# Patient Record
Sex: Female | Born: 1965 | Race: White | Hispanic: No | Marital: Married | Smoking: Never smoker
Health system: Southern US, Community
[De-identification: ages and names within clinical notes are randomized; demographics above are authoritative.]

## PROBLEM LIST (undated history)

## (undated) DIAGNOSIS — I341 Nonrheumatic mitral (valve) prolapse: Secondary | ICD-10-CM

## (undated) DIAGNOSIS — E039 Hypothyroidism, unspecified: Secondary | ICD-10-CM

## (undated) DIAGNOSIS — T7840XA Allergy, unspecified, initial encounter: Secondary | ICD-10-CM

## (undated) DIAGNOSIS — Z8669 Personal history of other diseases of the nervous system and sense organs: Secondary | ICD-10-CM

## (undated) DIAGNOSIS — E7800 Pure hypercholesterolemia, unspecified: Secondary | ICD-10-CM

## (undated) DIAGNOSIS — G4730 Sleep apnea, unspecified: Secondary | ICD-10-CM

## (undated) DIAGNOSIS — Z8489 Family history of other specified conditions: Secondary | ICD-10-CM

## (undated) HISTORY — DX: Hypothyroidism, unspecified: E03.9

## (undated) HISTORY — DX: Nonrheumatic mitral (valve) prolapse: I34.1

## (undated) HISTORY — DX: Allergy, unspecified, initial encounter: T78.40XA

## (undated) HISTORY — DX: Sleep apnea, unspecified: G47.30

## (undated) HISTORY — DX: Personal history of other diseases of the nervous system and sense organs: Z86.69

---

## 1984-11-23 HISTORY — PX: TMJ ARTHROSCOPY: SHX1067

## 1992-11-23 HISTORY — PX: LAPAROSCOPIC ABDOMINAL EXPLORATION: SHX6249

## 1998-01-25 ENCOUNTER — Inpatient Hospital Stay (HOSPITAL_COMMUNITY): Admission: AD | Admit: 1998-01-25 | Discharge: 1998-01-27 | Payer: Self-pay | Admitting: Obstetrics & Gynecology

## 1998-03-07 ENCOUNTER — Other Ambulatory Visit: Admission: RE | Admit: 1998-03-07 | Discharge: 1998-03-07 | Payer: Self-pay | Admitting: Obstetrics & Gynecology

## 1999-03-27 ENCOUNTER — Other Ambulatory Visit: Admission: RE | Admit: 1999-03-27 | Discharge: 1999-03-27 | Payer: Self-pay | Admitting: Obstetrics & Gynecology

## 2000-05-18 ENCOUNTER — Other Ambulatory Visit: Admission: RE | Admit: 2000-05-18 | Discharge: 2000-05-18 | Payer: Self-pay | Admitting: Obstetrics & Gynecology

## 2000-11-23 HISTORY — PX: BREAST BIOPSY: SHX20

## 2001-04-11 ENCOUNTER — Other Ambulatory Visit: Admission: RE | Admit: 2001-04-11 | Discharge: 2001-04-11 | Payer: Self-pay | Admitting: Unknown Physician Specialty

## 2001-04-11 ENCOUNTER — Encounter: Payer: Self-pay | Admitting: General Surgery

## 2001-04-11 ENCOUNTER — Encounter: Admission: RE | Admit: 2001-04-11 | Discharge: 2001-04-11 | Payer: Self-pay | Admitting: General Surgery

## 2001-04-11 ENCOUNTER — Encounter (INDEPENDENT_AMBULATORY_CARE_PROVIDER_SITE_OTHER): Payer: Self-pay | Admitting: Specialist

## 2001-07-13 ENCOUNTER — Other Ambulatory Visit: Admission: RE | Admit: 2001-07-13 | Discharge: 2001-07-13 | Payer: Self-pay | Admitting: Obstetrics & Gynecology

## 2002-12-05 ENCOUNTER — Other Ambulatory Visit: Admission: RE | Admit: 2002-12-05 | Discharge: 2002-12-05 | Payer: Self-pay | Admitting: Obstetrics & Gynecology

## 2003-04-12 ENCOUNTER — Emergency Department (HOSPITAL_COMMUNITY): Admission: EM | Admit: 2003-04-12 | Discharge: 2003-04-12 | Payer: Self-pay | Admitting: Emergency Medicine

## 2004-03-17 ENCOUNTER — Other Ambulatory Visit: Admission: RE | Admit: 2004-03-17 | Discharge: 2004-03-17 | Payer: Self-pay | Admitting: Obstetrics & Gynecology

## 2004-09-10 ENCOUNTER — Encounter: Admission: RE | Admit: 2004-09-10 | Discharge: 2004-09-10 | Payer: Self-pay | Admitting: Internal Medicine

## 2005-01-14 ENCOUNTER — Ambulatory Visit: Payer: Self-pay | Admitting: Cardiology

## 2005-01-22 ENCOUNTER — Ambulatory Visit: Payer: Self-pay

## 2005-08-11 ENCOUNTER — Other Ambulatory Visit: Admission: RE | Admit: 2005-08-11 | Discharge: 2005-08-11 | Payer: Self-pay | Admitting: Obstetrics & Gynecology

## 2007-02-10 ENCOUNTER — Encounter: Admission: RE | Admit: 2007-02-10 | Discharge: 2007-02-10 | Payer: Self-pay | Admitting: Obstetrics & Gynecology

## 2007-08-24 ENCOUNTER — Encounter: Admission: RE | Admit: 2007-08-24 | Discharge: 2007-08-24 | Payer: Self-pay | Admitting: Obstetrics & Gynecology

## 2008-01-31 ENCOUNTER — Ambulatory Visit: Payer: Self-pay | Admitting: Cardiology

## 2008-02-13 ENCOUNTER — Encounter: Admission: RE | Admit: 2008-02-13 | Discharge: 2008-02-13 | Payer: Self-pay | Admitting: Obstetrics & Gynecology

## 2008-02-15 ENCOUNTER — Ambulatory Visit: Payer: Self-pay

## 2008-02-15 ENCOUNTER — Encounter: Payer: Self-pay | Admitting: Cardiology

## 2008-02-28 ENCOUNTER — Encounter: Admission: RE | Admit: 2008-02-28 | Discharge: 2008-02-28 | Payer: Self-pay | Admitting: Obstetrics & Gynecology

## 2008-03-06 ENCOUNTER — Encounter: Admission: RE | Admit: 2008-03-06 | Discharge: 2008-03-06 | Payer: Self-pay | Admitting: Obstetrics & Gynecology

## 2008-09-28 ENCOUNTER — Encounter: Admission: RE | Admit: 2008-09-28 | Discharge: 2008-09-28 | Payer: Self-pay | Admitting: Obstetrics & Gynecology

## 2009-02-13 ENCOUNTER — Encounter: Admission: RE | Admit: 2009-02-13 | Discharge: 2009-02-13 | Payer: Self-pay | Admitting: Obstetrics & Gynecology

## 2010-03-10 ENCOUNTER — Encounter: Admission: RE | Admit: 2010-03-10 | Discharge: 2010-03-10 | Payer: Self-pay | Admitting: Obstetrics & Gynecology

## 2010-03-12 ENCOUNTER — Ambulatory Visit: Payer: Self-pay | Admitting: Cardiology

## 2010-03-12 DIAGNOSIS — R002 Palpitations: Secondary | ICD-10-CM | POA: Insufficient documentation

## 2010-12-18 ENCOUNTER — Encounter
Admission: RE | Admit: 2010-12-18 | Discharge: 2010-12-18 | Payer: Self-pay | Source: Home / Self Care | Attending: Obstetrics & Gynecology | Admitting: Obstetrics & Gynecology

## 2010-12-23 NOTE — Assessment & Plan Note (Signed)
Summary: f2y/ gd   Visit Type:  2 yr f/u Primary Provider:  Dr. Felipa Eth  CC:  no cardiac complaints today.  History of Present Illness: Cynthia Flowers returns today for evaluation management or palpitations. She been originally diagnosed with mitral valve prolapse but this has not shown up on 2 echocardiograms.  She's had no palpitations or chest pain. She remains very active and tries to walk as much as possible. She tried beta blockers in the past which made her feel worse.  Current Medications (verified): 1)  Multivitamins   Tabs (Multiple Vitamin) .Marland Kitchen.. 1 Tab Once Daily 2)  Levothyroxine Sodium 75 Mcg Tabs (Levothyroxine Sodium) .Marland Kitchen.. 1 Tab Once Daily 3)  Goodys Powder .... 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  Family History: Last updated: 03/07/2010  No premature history of coronary disease.  Mother had   some had coronary disease and heart attacks in her late 58s.   Social History: Last updated: 03/07/2010  She is married.  She has one child.  She is a   bookkeeper.   Review of Systems       negative other than history of present illness  Vital Signs:  Patient profile:   45 year old female Height:      66 inches Weight:      165 pounds BMI:     26.73 Pulse rate:   64 / minute Pulse rhythm:   regular BP sitting:   110 / 80  (left arm) Cuff size:   large  Vitals Entered By: Danielle Rankin, CMA (March 12, 2010 10:45 AM)  Physical Exam  General:  overweight, pleasant, in no acute distress Head:  normocephalic and atraumatic Eyes:  PERRLA/EOM intact; conjunctiva and lids normal. Neck:  Neck supple, no JVD. No masses, thyromegaly or abnormal cervical nodes. Chest Cynthia Flowers:  no deformities or breast masses noted Lungs:  Clear  bilaterally to auscultation and percussion. Heart:  Non-displaced PMI, chest non-tender; regular rate and rhythm, S1, S2 without murmurs, rubs or gallops. Carotid upstroke normal, no bruit. Normal abdominal aortic size, no bruits. Femorals normal pulses, no bruits. Pedals normal pulses. No edema, no varicosities. Abdomen:  Bowel sounds positive; abdomen soft and non-tender without masses, organomegaly, or hernias noted. No hepatosplenomegaly. Msk:  Back normal, normal gait. Muscle strength and tone normal. Pulses:  pulses normal in all 4 extremities Extremities:  No clubbing or cyanosis. Neurologic:  Alert and oriented x 3.   Problems:  Medical Problems Added: 1)  Dx of Palpitations  (ICD-785.1)  EKG  Procedure date:  03/12/2010  Findings:      normal sinus rhythm, normal EKG  Impression & Recommendations:  Problem # 1:  PALPITATIONS (ICD-785.1) Assessment Improved Her palpitations are almost nonexistent. She continues to exercise. She tried beta blockers in the past which made her feel worse. She does not have prolapse by echocardiography as she been told in the past. No change in strategy at this point. I'll see her back on a p.r.n. basis.  Other Orders: EKG w/ Interpretation (93000)  Patient Instructions: 1)  Your physician recommends that you schedule a follow-up appointment in: AS NEEDED 2)  Your physician recommends that you continue on your current medications as directed. Please refer to the Current Medication list given to you today.

## 2011-04-07 NOTE — Assessment & Plan Note (Signed)
[facility]                            CARDIOLOGY OFFICE NOTE   AVA, TANGNEY                        MRN:          [id number]  DATE:01/31/2008                            DOB:          25-Sep-1966    I was asked by Dr. Felipa Eth to consult on Cynthia Flowers with a history of  mitral valve prolapse.   We last evaluated Cynthia Flowers in March 2006.  A 2-D echocardiogram at that  time did not show any significant prolapse; in fact, it showed no  prolapse.  There was no mitral regurgitation either.   She had normal left ventricular function, normal right-sided structures.  Essentially, the echocardiogram was normal.  She was reassured.   She has had some atypical chest pain, and some palpitations.  She does  not notice these with exertion or activity.  She notices it most of the  time when she is sitting still or lying down.  She has no significant  dyspnea on exertion.   She denies any syncope or presyncope.  She has had no symptoms  consistent with angina.   Her risk factors are minimal at this point for coronary disease.   PAST MEDICAL HISTORY:   CURRENT MEDICATIONS:  Multivitamin daily with thyroxine 75 mcg a day.   She does not smoke, does not drink.   ALLERGIES:  She is not allergic to any medications.  She does not have a  dye reaction.   SURGERY:  1. 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  2. Laparoscopic surgery of to remove some adhesions in 1992.   She carries a diagnosis of hypothyroidism on replacement therapy.   FAMILY HISTORY:  No premature history of coronary disease.  Mother had  some had coronary disease and heart attacks in her late 88s.   SOCIAL HISTORY:  She is married.  She has one child.  She is a  bookkeeper.   REVIEW OF SYSTEMS:  Other than HPI, all 15 Review of System point  markers were reviewed.   PHYSICAL EXAMINATION:  VITAL SIGNS:  Her blood pressure today is 130/71.  Her pulse is 78 and regular.  Weight is 152.  HEENT:   Normocephalic, atraumatic.  PERRLA.  Extraocular movements  intact.  Sclerae are clear.  Face symmetry is normal.  NECK:  Carotids are equal bilaterally without bruits, no JVD.  Thyroid  is not enlarged.  Trachea is midline.  LUNGS:  Clear.  HEART:  Reveals a regular rate and rhythm.  PMI is nondisplaced.  She  has no obvious click.  No murmur.  S2 is split.  ABDOMEN:  Soft, good bowel sounds.  No midline bruit, no hepatomegaly.  EXTREMITIES:  No sinus clubbing or edema.  Pulses are intact.  NEUROLOGIC:  Exam is intact.   Electrocardiogram is completely normal.   ASSESSMENT:  Atypical chest pain and palpitations.  She has been told  that she has mitral valve prolapse in the past. but this was not  documented on her last echocardiogram.  The patient needs reassurance.   PLAN:  1. A 2-D echocardiogram to assess  LV function and to again look at the      mitral valve.  2. Reassurance.  3. Walking or some sort of cardiac activity 3 hours per week.  4. Followup blood work with Dr. Felipa Eth including her thyroid      replacement.   I will plan on seeing her back in 2 years or p.r.n.     Thomas C. Daleen Squibb, MD, [facility]  Electronically Signed    TCW/MedQ  DD: 01/31/2008  DT: 02/01/2008  Job #: [id number]   cc:   Larina Earthly, M.D.

## 2011-04-09 ENCOUNTER — Other Ambulatory Visit: Payer: Self-pay | Admitting: Obstetrics & Gynecology

## 2011-04-09 DIAGNOSIS — Z1231 Encounter for screening mammogram for malignant neoplasm of breast: Secondary | ICD-10-CM

## 2011-04-23 ENCOUNTER — Ambulatory Visit: Payer: Self-pay

## 2011-04-24 ENCOUNTER — Ambulatory Visit
Admission: RE | Admit: 2011-04-24 | Discharge: 2011-04-24 | Disposition: A | Discharge status: Home / Self Care | Payer: BC Managed Care – PPO | Source: Ambulatory Visit | Attending: Obstetrics & Gynecology | Admitting: Obstetrics & Gynecology

## 2011-04-24 ENCOUNTER — Ambulatory Visit: Payer: Self-pay

## 2011-04-24 DIAGNOSIS — Z1231 Encounter for screening mammogram for malignant neoplasm of breast: Secondary | ICD-10-CM

## 2011-11-24 HISTORY — PX: BREAST BIOPSY: SHX20

## 2012-05-13 ENCOUNTER — Other Ambulatory Visit: Payer: Self-pay | Admitting: Obstetrics & Gynecology

## 2012-05-13 DIAGNOSIS — Z1231 Encounter for screening mammogram for malignant neoplasm of breast: Secondary | ICD-10-CM

## 2012-06-22 ENCOUNTER — Ambulatory Visit
Admission: RE | Admit: 2012-06-22 | Discharge: 2012-06-22 | Disposition: A | Discharge status: Home / Self Care | Payer: BC Managed Care – PPO | Source: Ambulatory Visit | Attending: Obstetrics & Gynecology | Admitting: Obstetrics & Gynecology

## 2012-06-22 DIAGNOSIS — Z1231 Encounter for screening mammogram for malignant neoplasm of breast: Secondary | ICD-10-CM

## 2012-06-24 ENCOUNTER — Other Ambulatory Visit: Payer: Self-pay | Admitting: Obstetrics & Gynecology

## 2012-06-24 DIAGNOSIS — R928 Other abnormal and inconclusive findings on diagnostic imaging of breast: Secondary | ICD-10-CM

## 2012-07-13 ENCOUNTER — Other Ambulatory Visit: Payer: Self-pay | Admitting: Obstetrics & Gynecology

## 2012-07-13 ENCOUNTER — Ambulatory Visit
Admission: RE | Admit: 2012-07-13 | Discharge: 2012-07-13 | Disposition: A | Discharge status: Home / Self Care | Payer: BC Managed Care – PPO | Source: Ambulatory Visit | Attending: Obstetrics & Gynecology | Admitting: Obstetrics & Gynecology

## 2012-07-13 DIAGNOSIS — R928 Other abnormal and inconclusive findings on diagnostic imaging of breast: Secondary | ICD-10-CM

## 2012-07-19 ENCOUNTER — Other Ambulatory Visit: Payer: Self-pay | Admitting: Obstetrics & Gynecology

## 2012-07-19 ENCOUNTER — Ambulatory Visit
Admission: RE | Admit: 2012-07-19 | Discharge: 2012-07-19 | Disposition: A | Discharge status: Home / Self Care | Payer: BC Managed Care – PPO | Source: Ambulatory Visit | Attending: Obstetrics & Gynecology | Admitting: Obstetrics & Gynecology

## 2012-07-19 DIAGNOSIS — R928 Other abnormal and inconclusive findings on diagnostic imaging of breast: Secondary | ICD-10-CM

## 2013-08-01 ENCOUNTER — Other Ambulatory Visit: Payer: Self-pay | Admitting: Obstetrics & Gynecology

## 2013-08-01 ENCOUNTER — Other Ambulatory Visit: Payer: Self-pay

## 2013-08-01 DIAGNOSIS — Z1231 Encounter for screening mammogram for malignant neoplasm of breast: Secondary | ICD-10-CM

## 2013-08-24 ENCOUNTER — Ambulatory Visit
Admission: RE | Admit: 2013-08-24 | Discharge: 2013-08-24 | Disposition: A | Discharge status: Home / Self Care | Payer: BC Managed Care – HMO | Source: Ambulatory Visit

## 2013-08-24 DIAGNOSIS — Z1231 Encounter for screening mammogram for malignant neoplasm of breast: Secondary | ICD-10-CM

## 2014-08-14 ENCOUNTER — Other Ambulatory Visit: Payer: Self-pay

## 2014-08-14 DIAGNOSIS — Z1231 Encounter for screening mammogram for malignant neoplasm of breast: Secondary | ICD-10-CM

## 2014-08-28 ENCOUNTER — Ambulatory Visit
Admission: RE | Admit: 2014-08-28 | Discharge: 2014-08-28 | Disposition: A | Discharge status: Home / Self Care | Payer: BC Managed Care – PPO | Source: Ambulatory Visit

## 2014-08-28 ENCOUNTER — Encounter (INDEPENDENT_AMBULATORY_CARE_PROVIDER_SITE_OTHER): Payer: Self-pay

## 2014-08-28 DIAGNOSIS — Z1231 Encounter for screening mammogram for malignant neoplasm of breast: Secondary | ICD-10-CM

## 2014-09-17 ENCOUNTER — Other Ambulatory Visit: Payer: Self-pay | Admitting: Obstetrics & Gynecology

## 2014-09-18 LAB — CYTOLOGY - PAP

## 2014-11-23 HISTORY — PX: OTHER SURGICAL HISTORY: SHX169

## 2015-10-07 ENCOUNTER — Encounter: Payer: Self-pay | Admitting: Internal Medicine

## 2015-11-28 ENCOUNTER — Ambulatory Visit (AMBULATORY_SURGERY_CENTER): Payer: Self-pay

## 2015-11-28 VITALS — Ht 66.0 in | Wt 171.6 lb

## 2015-11-28 DIAGNOSIS — Z1211 Encounter for screening for malignant neoplasm of colon: Secondary | ICD-10-CM

## 2015-11-28 MED ORDER — SUPREP BOWEL PREP KIT 17.5-3.13-1.6 GM/177ML PO SOLN: 1.0000 | Freq: Once | ORAL | Status: DC

## 2015-11-28 NOTE — Progress Notes (Signed)
No allergies to eggs or soy No home oxygen No past problems with anesthesia No diet/weight loss meds  Has internet; registered for emmi

## 2015-12-12 ENCOUNTER — Telehealth: Payer: Self-pay | Admitting: Internal Medicine

## 2015-12-12 ENCOUNTER — Encounter: Payer: Self-pay | Admitting: Internal Medicine

## 2016-01-01 ENCOUNTER — Other Ambulatory Visit: Payer: Self-pay

## 2016-01-01 DIAGNOSIS — Z1231 Encounter for screening mammogram for malignant neoplasm of breast: Secondary | ICD-10-CM

## 2016-01-23 ENCOUNTER — Ambulatory Visit (AMBULATORY_SURGERY_CENTER): Payer: Self-pay | Admitting: *Deleted

## 2016-01-23 VITALS — Ht 66.0 in | Wt 176.0 lb

## 2016-01-23 DIAGNOSIS — Z1211 Encounter for screening for malignant neoplasm of colon: Secondary | ICD-10-CM

## 2016-01-23 MED ORDER — NA SULFATE-K SULFATE-MG SULF 17.5-3.13-1.6 GM/177ML PO SOLN: ORAL | Status: DC

## 2016-01-23 NOTE — Progress Notes (Signed)
No allergies to eggs or soy. No problems with anesthesia.  Pt not given Emmi instructions for colonoscopy; has recently viewed  No oxygen use  No diet drug use

## 2016-02-03 ENCOUNTER — Other Ambulatory Visit: Payer: Self-pay | Admitting: Obstetrics & Gynecology

## 2016-02-03 DIAGNOSIS — N6320 Unspecified lump in the left breast, unspecified quadrant: Secondary | ICD-10-CM

## 2016-02-06 ENCOUNTER — Encounter: Payer: Self-pay | Admitting: Internal Medicine

## 2016-02-06 ENCOUNTER — Ambulatory Visit (AMBULATORY_SURGERY_CENTER): Payer: BLUE CROSS/BLUE SHIELD | Admitting: Internal Medicine

## 2016-02-06 VITALS — BP 135/76 | HR 59 | Temp 98.9°F | Resp 9 | Ht 66.0 in | Wt 176.0 lb

## 2016-02-06 DIAGNOSIS — Z1211 Encounter for screening for malignant neoplasm of colon: Secondary | ICD-10-CM | POA: Diagnosis present

## 2016-02-06 DIAGNOSIS — D123 Benign neoplasm of transverse colon: Secondary | ICD-10-CM

## 2016-02-06 DIAGNOSIS — D125 Benign neoplasm of sigmoid colon: Secondary | ICD-10-CM | POA: Diagnosis not present

## 2016-02-06 MED ORDER — SODIUM CHLORIDE 0.9 % IV SOLN: 500.0000 mL | INTRAVENOUS | Status: DC

## 2016-02-06 NOTE — Patient Instructions (Signed)
YOU HAD AN ENDOSCOPIC PROCEDURE TODAY AT [facility]:   Refer to the procedure report that was given to you for any specific questions about what was found during the examination.  If the procedure report does not answer your questions, please call your gastroenterologist to clarify.  If you requested that your care partner not be given the details of your procedure findings, then the procedure report has been included in a sealed envelope for you to review at your convenience later.  YOU SHOULD EXPECT: Some feelings of bloating in the abdomen. Passage of more gas than usual.  Walking can help get rid of the air that was put into your GI tract during the procedure and reduce the bloating. If you had a lower endoscopy (such as a colonoscopy or flexible sigmoidoscopy) you may notice spotting of blood in your stool or on the toilet paper. If you underwent a bowel prep for your procedure, you may not have a normal bowel movement for a few days.  Please Note:  You might notice some irritation and congestion in your nose or some drainage.  This is from the oxygen used during your procedure.  There is no need for concern and it should clear up in a day or so.  SYMPTOMS TO REPORT IMMEDIATELY:   Following lower endoscopy (colonoscopy or flexible sigmoidoscopy):  Excessive amounts of blood in the stool  Significant tenderness or worsening of abdominal pains  Swelling of the abdomen that is new, acute  Fever of 100F or higher    For urgent or emergent issues, a gastroenterologist can be reached at any hour by calling [phone].   DIET: Your first meal following the procedure should be a small meal and then it is ok to progress to your normal diet. Heavy or fried foods are harder to digest and may make you feel nauseous or bloated.  Likewise, meals heavy in dairy and vegetables can increase bloating.  Drink plenty of fluids but you should avoid alcoholic beverages for 24  hours.  ACTIVITY:  You should plan to take it easy for the rest of today and you should NOT DRIVE or use heavy machinery until tomorrow (because of the sedation medicines used during the test).    FOLLOW UP: Our staff will call the number listed on your records the next business day following your procedure to check on you and address any questions or concerns that you may have regarding the information given to you following your procedure. If we do not reach you, we will leave a message.  However, if you are feeling well and you are not experiencing any problems, there is no need to return our call.  We will assume that you have returned to your regular daily activities without incident.  If any biopsies were taken you will be contacted by phone or by letter within the next 1-3 weeks.  Please call us at [phone] if you have not heard about the biopsies in 3 weeks.    SIGNATURES/CONFIDENTIALITY: You and/or your care partner have signed paperwork which will be entered into your electronic medical record.  These signatures attest to the fact that that the information above on your After Visit Summary has been reviewed and is understood.  Full responsibility of the confidentiality of this discharge information lies with you and/or your care-partner.  Please read all the handouts given to you by your recovery nurse. Thank you for letting us take care of your healthcare needs.

## 2016-02-06 NOTE — Progress Notes (Signed)
Called to room to assist during endoscopic procedure.  Patient ID and intended procedure confirmed with present staff. Received instructions for my participation in the procedure from the performing physician.  

## 2016-02-06 NOTE — Progress Notes (Signed)
Report to PACU, RN, vss, BBS= Clear.  

## 2016-02-06 NOTE — Op Note (Signed)
[facility] Patient Name: Cynthia Flowers Procedure Date: 02/06/2016 10:58 AM MRN: [id number] Endoscopist: Jerene Bears , MD Age: 50 Referring MD:  Date of Birth: 01-28-1966 Gender: Female Procedure:                Colonoscopy Indications:              Screening for colorectal malignant neoplasm, This                            is the patient's first colonoscopy Medicines:                Monitored Anesthesia Care Procedure:                Pre-Anesthesia Assessment:                           - Prior to the procedure, a History and Physical                            was performed, and patient medications and                            allergies were reviewed. The patient's tolerance of                            previous anesthesia was also reviewed. The risks                            and benefits of the procedure and the sedation                            options and risks were discussed with the patient.                            All questions were answered, and informed consent                            was obtained. Prior Anticoagulants: The patient has                            taken no previous anticoagulant or antiplatelet                            agents. ASA Grade Assessment: II - A patient with                            mild systemic disease. After reviewing the risks                            and benefits, the patient was deemed in                            satisfactory condition to undergo the procedure.  After obtaining informed consent, the colonoscope                            was passed under direct vision. Throughout the                            procedure, the patient's blood pressure, pulse, and                            oxygen saturations were monitored continuously. The                            Model PCF-H190L [id number]) scope was introduced                            through the anus and advanced to the the cecum,                           identified by appendiceal orifice and ileocecal                            valve. The colonoscopy was performed without                            difficulty. The patient tolerated the procedure                            well. The quality of the bowel preparation was                            good. The ileocecal valve, appendiceal orifice, and                            rectum were photographed. Scope In: 11:01:49 AM Scope Out: 11:19:09 AM Scope Withdrawal Time: 0 hours 13 minutes 39 seconds  Total Procedure Duration: 0 hours 17 minutes 20 seconds  Findings:      The digital rectal exam was normal.      Three sessile polyps were found in the sigmoid colon (2) and hepatic       flexure (1). The polyps were 3 to 5 mm in size. These polyps were       removed with a cold snare. Resection and retrieval were complete.      The exam was otherwise without abnormality on direct and retroflexion       views. Complications:            No immediate complications. Estimated Blood Loss:     Estimated blood loss was minimal. Impression:               - Three 3 to 5 mm polyps in the sigmoid colon and                            at the hepatic flexure, removed with a cold snare.  Resected and retrieved.                           - The examination was otherwise normal on direct                            and retroflexion views. Recommendation:           - Patient has a contact number available for                            emergencies. The signs and symptoms of potential                            delayed complications were discussed with the                            patient. Return to normal activities tomorrow.                            Written discharge instructions were provided to the                            patient.                           - Resume previous diet.                           - Continue present medications.                            - Await pathology results.                           - Repeat colonoscopy is recommended for                            surveillance. The colonoscopy date will be                            determined after pathology results from today's                            exam become available for review. Procedure Code(s):        --- Professional ---                           [phone], Colonoscopy, flexible; with removal of                            tumor(s), polyp(s), or other lesion(s) by snare                            technique CPT copyright 2016 [employer]. All rights reserved. Lajuan Lines. Hilarie Fredrickson, MD Jerene Bears, MD 02/06/2016 11:23:36 AM This report has been signed  electronically. Number of Addenda: 0

## 2016-02-07 ENCOUNTER — Telehealth: Payer: Self-pay | Admitting: *Deleted

## 2016-02-07 NOTE — Telephone Encounter (Signed)
  Follow up Call-  Call back number 02/06/2016  Post procedure Call Back phone  # [phone]  Permission to leave phone message Yes     Patient questions:  Do you have a fever, pain , or abdominal swelling? No. Pain Score  0 *  Have you tolerated food without any problems? Yes  Have you been able to return to your normal activities? Yes.    Do you have any questions about your discharge instructions: Diet   No. Medications  No. Follow up visit  No.  Do you have questions or concerns about your Care? No.  Actions: * If pain score is 4 or above: No action needed, pain <4.

## 2016-02-11 ENCOUNTER — Ambulatory Visit
Admission: RE | Admit: 2016-02-11 | Discharge: 2016-02-11 | Disposition: A | Discharge status: Home / Self Care | Payer: BLUE CROSS/BLUE SHIELD | Source: Ambulatory Visit | Attending: Obstetrics & Gynecology | Admitting: Obstetrics & Gynecology

## 2016-02-11 ENCOUNTER — Encounter: Payer: Self-pay | Admitting: Internal Medicine

## 2016-02-11 ENCOUNTER — Ambulatory Visit: Payer: BLUE CROSS/BLUE SHIELD

## 2016-02-11 DIAGNOSIS — N6320 Unspecified lump in the left breast, unspecified quadrant: Secondary | ICD-10-CM

## 2016-08-31 DIAGNOSIS — E038 Other specified hypothyroidism: Secondary | ICD-10-CM | POA: Diagnosis not present

## 2016-08-31 DIAGNOSIS — R8299 Other abnormal findings in urine: Secondary | ICD-10-CM | POA: Diagnosis not present

## 2016-08-31 DIAGNOSIS — Z0000 Encounter for general adult medical examination without abnormal findings: Secondary | ICD-10-CM | POA: Diagnosis not present

## 2016-09-07 DIAGNOSIS — H02402 Unspecified ptosis of left eyelid: Secondary | ICD-10-CM | POA: Diagnosis not present

## 2016-09-07 DIAGNOSIS — E038 Other specified hypothyroidism: Secondary | ICD-10-CM | POA: Diagnosis not present

## 2016-09-07 DIAGNOSIS — Z0000 Encounter for general adult medical examination without abnormal findings: Secondary | ICD-10-CM | POA: Diagnosis not present

## 2016-09-07 DIAGNOSIS — R109 Unspecified abdominal pain: Secondary | ICD-10-CM | POA: Diagnosis not present

## 2016-09-07 DIAGNOSIS — E784 Other hyperlipidemia: Secondary | ICD-10-CM | POA: Diagnosis not present

## 2016-09-11 DIAGNOSIS — Z1212 Encounter for screening for malignant neoplasm of rectum: Secondary | ICD-10-CM | POA: Diagnosis not present

## 2017-01-20 ENCOUNTER — Other Ambulatory Visit: Payer: Self-pay | Admitting: Internal Medicine

## 2017-01-20 DIAGNOSIS — Z1231 Encounter for screening mammogram for malignant neoplasm of breast: Secondary | ICD-10-CM

## 2017-02-01 DIAGNOSIS — Z6831 Body mass index (BMI) 31.0-31.9, adult: Secondary | ICD-10-CM | POA: Diagnosis not present

## 2017-02-01 DIAGNOSIS — Z01419 Encounter for gynecological examination (general) (routine) without abnormal findings: Secondary | ICD-10-CM | POA: Diagnosis not present

## 2017-02-01 DIAGNOSIS — Z13820 Encounter for screening for osteoporosis: Secondary | ICD-10-CM | POA: Diagnosis not present

## 2017-02-12 ENCOUNTER — Ambulatory Visit
Admission: RE | Admit: 2017-02-12 | Discharge: 2017-02-12 | Disposition: A | Discharge status: Home / Self Care | Payer: BLUE CROSS/BLUE SHIELD | Source: Ambulatory Visit | Attending: Internal Medicine | Admitting: Internal Medicine

## 2017-02-12 DIAGNOSIS — Z1231 Encounter for screening mammogram for malignant neoplasm of breast: Secondary | ICD-10-CM

## 2017-02-15 ENCOUNTER — Other Ambulatory Visit: Payer: Self-pay | Admitting: Obstetrics & Gynecology

## 2017-02-15 DIAGNOSIS — Z1231 Encounter for screening mammogram for malignant neoplasm of breast: Secondary | ICD-10-CM

## 2017-02-16 ENCOUNTER — Ambulatory Visit
Admission: RE | Admit: 2017-02-16 | Discharge: 2017-02-16 | Disposition: A | Discharge status: Home / Self Care | Payer: BLUE CROSS/BLUE SHIELD | Source: Ambulatory Visit | Attending: Obstetrics & Gynecology | Admitting: Obstetrics & Gynecology

## 2017-02-16 DIAGNOSIS — Z1231 Encounter for screening mammogram for malignant neoplasm of breast: Secondary | ICD-10-CM | POA: Diagnosis not present

## 2017-06-24 DIAGNOSIS — H02431 Paralytic ptosis of right eyelid: Secondary | ICD-10-CM | POA: Diagnosis not present

## 2017-09-04 DIAGNOSIS — Z23 Encounter for immunization: Secondary | ICD-10-CM | POA: Diagnosis not present

## 2018-01-12 ENCOUNTER — Other Ambulatory Visit: Payer: Self-pay | Admitting: Obstetrics & Gynecology

## 2018-01-12 DIAGNOSIS — Z1231 Encounter for screening mammogram for malignant neoplasm of breast: Secondary | ICD-10-CM

## 2018-02-03 DIAGNOSIS — Z808 Family history of malignant neoplasm of other organs or systems: Secondary | ICD-10-CM | POA: Diagnosis not present

## 2018-02-03 DIAGNOSIS — Z806 Family history of leukemia: Secondary | ICD-10-CM | POA: Diagnosis not present

## 2018-02-03 DIAGNOSIS — Z8041 Family history of malignant neoplasm of ovary: Secondary | ICD-10-CM | POA: Diagnosis not present

## 2018-02-03 DIAGNOSIS — Z01419 Encounter for gynecological examination (general) (routine) without abnormal findings: Secondary | ICD-10-CM | POA: Diagnosis not present

## 2018-02-03 DIAGNOSIS — Z6832 Body mass index (BMI) 32.0-32.9, adult: Secondary | ICD-10-CM | POA: Diagnosis not present

## 2018-02-03 DIAGNOSIS — Z8049 Family history of malignant neoplasm of other genital organs: Secondary | ICD-10-CM | POA: Diagnosis not present

## 2018-02-17 ENCOUNTER — Ambulatory Visit
Admission: RE | Admit: 2018-02-17 | Discharge: 2018-02-17 | Disposition: A | Discharge status: Home / Self Care | Payer: BLUE CROSS/BLUE SHIELD | Source: Ambulatory Visit | Attending: Obstetrics & Gynecology | Admitting: Obstetrics & Gynecology

## 2018-02-17 DIAGNOSIS — Z1231 Encounter for screening mammogram for malignant neoplasm of breast: Secondary | ICD-10-CM | POA: Diagnosis not present

## 2018-02-21 DIAGNOSIS — Z0000 Encounter for general adult medical examination without abnormal findings: Secondary | ICD-10-CM | POA: Diagnosis not present

## 2018-02-21 DIAGNOSIS — E038 Other specified hypothyroidism: Secondary | ICD-10-CM | POA: Diagnosis not present

## 2018-02-28 DIAGNOSIS — R109 Unspecified abdominal pain: Secondary | ICD-10-CM | POA: Diagnosis not present

## 2018-02-28 DIAGNOSIS — Z1389 Encounter for screening for other disorder: Secondary | ICD-10-CM | POA: Diagnosis not present

## 2018-02-28 DIAGNOSIS — E038 Other specified hypothyroidism: Secondary | ICD-10-CM | POA: Diagnosis not present

## 2018-02-28 DIAGNOSIS — E7849 Other hyperlipidemia: Secondary | ICD-10-CM | POA: Diagnosis not present

## 2018-02-28 DIAGNOSIS — Z0000 Encounter for general adult medical examination without abnormal findings: Secondary | ICD-10-CM | POA: Diagnosis not present

## 2018-02-28 DIAGNOSIS — E668 Other obesity: Secondary | ICD-10-CM | POA: Diagnosis not present

## 2018-03-15 DIAGNOSIS — Z1212 Encounter for screening for malignant neoplasm of rectum: Secondary | ICD-10-CM | POA: Diagnosis not present

## 2018-03-21 DIAGNOSIS — Z809 Family history of malignant neoplasm, unspecified: Secondary | ICD-10-CM | POA: Diagnosis not present

## 2018-08-16 DIAGNOSIS — M79672 Pain in left foot: Secondary | ICD-10-CM | POA: Diagnosis not present

## 2018-08-16 DIAGNOSIS — M722 Plantar fascial fibromatosis: Secondary | ICD-10-CM | POA: Diagnosis not present

## 2018-08-31 DIAGNOSIS — M79672 Pain in left foot: Secondary | ICD-10-CM | POA: Diagnosis not present

## 2018-09-29 DIAGNOSIS — M25572 Pain in left ankle and joints of left foot: Secondary | ICD-10-CM | POA: Diagnosis not present

## 2018-09-29 DIAGNOSIS — M79672 Pain in left foot: Secondary | ICD-10-CM | POA: Diagnosis not present

## 2018-09-29 DIAGNOSIS — M722 Plantar fascial fibromatosis: Secondary | ICD-10-CM | POA: Diagnosis not present

## 2018-09-30 DIAGNOSIS — K2970 Gastritis, unspecified, without bleeding: Secondary | ICD-10-CM | POA: Diagnosis not present

## 2018-09-30 DIAGNOSIS — Z6832 Body mass index (BMI) 32.0-32.9, adult: Secondary | ICD-10-CM | POA: Diagnosis not present

## 2018-09-30 DIAGNOSIS — M170 Bilateral primary osteoarthritis of knee: Secondary | ICD-10-CM | POA: Diagnosis not present

## 2018-09-30 DIAGNOSIS — E669 Obesity, unspecified: Secondary | ICD-10-CM | POA: Diagnosis not present

## 2018-10-13 DIAGNOSIS — M79672 Pain in left foot: Secondary | ICD-10-CM | POA: Diagnosis not present

## 2018-10-18 DIAGNOSIS — M722 Plantar fascial fibromatosis: Secondary | ICD-10-CM | POA: Diagnosis not present

## 2018-10-18 DIAGNOSIS — M79672 Pain in left foot: Secondary | ICD-10-CM | POA: Diagnosis not present

## 2018-11-07 DIAGNOSIS — H1013 Acute atopic conjunctivitis, bilateral: Secondary | ICD-10-CM | POA: Diagnosis not present

## 2018-11-14 DIAGNOSIS — M722 Plantar fascial fibromatosis: Secondary | ICD-10-CM | POA: Diagnosis not present

## 2019-01-12 ENCOUNTER — Other Ambulatory Visit: Payer: Self-pay | Admitting: Obstetrics & Gynecology

## 2019-01-12 DIAGNOSIS — Z1231 Encounter for screening mammogram for malignant neoplasm of breast: Secondary | ICD-10-CM

## 2019-02-07 DIAGNOSIS — Z6833 Body mass index (BMI) 33.0-33.9, adult: Secondary | ICD-10-CM | POA: Diagnosis not present

## 2019-02-07 DIAGNOSIS — Z01419 Encounter for gynecological examination (general) (routine) without abnormal findings: Secondary | ICD-10-CM | POA: Diagnosis not present

## 2019-02-20 ENCOUNTER — Ambulatory Visit: Payer: BLUE CROSS/BLUE SHIELD

## 2019-02-28 DIAGNOSIS — E039 Hypothyroidism, unspecified: Secondary | ICD-10-CM | POA: Diagnosis not present

## 2019-02-28 DIAGNOSIS — Z0000 Encounter for general adult medical examination without abnormal findings: Secondary | ICD-10-CM | POA: Diagnosis not present

## 2019-02-28 DIAGNOSIS — E7849 Other hyperlipidemia: Secondary | ICD-10-CM | POA: Diagnosis not present

## 2019-03-08 DIAGNOSIS — E039 Hypothyroidism, unspecified: Secondary | ICD-10-CM | POA: Diagnosis not present

## 2019-03-08 DIAGNOSIS — E669 Obesity, unspecified: Secondary | ICD-10-CM | POA: Diagnosis not present

## 2019-03-08 DIAGNOSIS — E785 Hyperlipidemia, unspecified: Secondary | ICD-10-CM | POA: Diagnosis not present

## 2019-03-08 DIAGNOSIS — Z1331 Encounter for screening for depression: Secondary | ICD-10-CM | POA: Diagnosis not present

## 2019-03-08 DIAGNOSIS — M170 Bilateral primary osteoarthritis of knee: Secondary | ICD-10-CM | POA: Diagnosis not present

## 2019-03-08 DIAGNOSIS — Z0000 Encounter for general adult medical examination without abnormal findings: Secondary | ICD-10-CM | POA: Diagnosis not present

## 2019-03-21 ENCOUNTER — Ambulatory Visit: Payer: BLUE CROSS/BLUE SHIELD

## 2019-05-15 ENCOUNTER — Ambulatory Visit
Admission: RE | Admit: 2019-05-15 | Discharge: 2019-05-15 | Disposition: A | Discharge status: Home / Self Care | Payer: BC Managed Care – PPO | Source: Ambulatory Visit | Attending: Obstetrics & Gynecology | Admitting: Obstetrics & Gynecology

## 2019-05-15 DIAGNOSIS — Z1231 Encounter for screening mammogram for malignant neoplasm of breast: Secondary | ICD-10-CM

## 2019-05-17 ENCOUNTER — Other Ambulatory Visit: Payer: Self-pay | Admitting: Obstetrics & Gynecology

## 2019-05-17 DIAGNOSIS — R928 Other abnormal and inconclusive findings on diagnostic imaging of breast: Secondary | ICD-10-CM

## 2019-05-19 ENCOUNTER — Other Ambulatory Visit: Payer: Self-pay

## 2019-05-19 ENCOUNTER — Ambulatory Visit
Admission: RE | Admit: 2019-05-19 | Discharge: 2019-05-19 | Disposition: A | Discharge status: Home / Self Care | Payer: BC Managed Care – PPO | Source: Ambulatory Visit | Attending: Obstetrics & Gynecology | Admitting: Obstetrics & Gynecology

## 2019-05-19 ENCOUNTER — Ambulatory Visit: Payer: BC Managed Care – PPO

## 2019-05-19 DIAGNOSIS — R922 Inconclusive mammogram: Secondary | ICD-10-CM | POA: Diagnosis not present

## 2019-05-19 DIAGNOSIS — R928 Other abnormal and inconclusive findings on diagnostic imaging of breast: Secondary | ICD-10-CM

## 2019-06-14 DIAGNOSIS — E7849 Other hyperlipidemia: Secondary | ICD-10-CM | POA: Diagnosis not present

## 2019-06-14 DIAGNOSIS — Z23 Encounter for immunization: Secondary | ICD-10-CM | POA: Diagnosis not present

## 2019-06-19 DIAGNOSIS — E669 Obesity, unspecified: Secondary | ICD-10-CM | POA: Diagnosis not present

## 2019-06-19 DIAGNOSIS — E039 Hypothyroidism, unspecified: Secondary | ICD-10-CM | POA: Diagnosis not present

## 2019-06-19 DIAGNOSIS — E785 Hyperlipidemia, unspecified: Secondary | ICD-10-CM | POA: Diagnosis not present

## 2019-10-25 DIAGNOSIS — X32XXXA Exposure to sunlight, initial encounter: Secondary | ICD-10-CM | POA: Diagnosis not present

## 2019-10-25 DIAGNOSIS — D225 Melanocytic nevi of trunk: Secondary | ICD-10-CM | POA: Diagnosis not present

## 2019-10-25 DIAGNOSIS — L570 Actinic keratosis: Secondary | ICD-10-CM | POA: Diagnosis not present

## 2019-11-20 DIAGNOSIS — H5213 Myopia, bilateral: Secondary | ICD-10-CM | POA: Diagnosis not present

## 2019-11-20 DIAGNOSIS — H10413 Chronic giant papillary conjunctivitis, bilateral: Secondary | ICD-10-CM | POA: Diagnosis not present

## 2020-02-12 DIAGNOSIS — Z01419 Encounter for gynecological examination (general) (routine) without abnormal findings: Secondary | ICD-10-CM | POA: Diagnosis not present

## 2020-02-12 DIAGNOSIS — Z6834 Body mass index (BMI) 34.0-34.9, adult: Secondary | ICD-10-CM | POA: Diagnosis not present

## 2020-03-05 DIAGNOSIS — Z0000 Encounter for general adult medical examination without abnormal findings: Secondary | ICD-10-CM | POA: Diagnosis not present

## 2020-03-05 DIAGNOSIS — E038 Other specified hypothyroidism: Secondary | ICD-10-CM | POA: Diagnosis not present

## 2020-03-05 DIAGNOSIS — E7849 Other hyperlipidemia: Secondary | ICD-10-CM | POA: Diagnosis not present

## 2020-03-12 DIAGNOSIS — E669 Obesity, unspecified: Secondary | ICD-10-CM | POA: Diagnosis not present

## 2020-03-12 DIAGNOSIS — E039 Hypothyroidism, unspecified: Secondary | ICD-10-CM | POA: Diagnosis not present

## 2020-03-12 DIAGNOSIS — M170 Bilateral primary osteoarthritis of knee: Secondary | ICD-10-CM | POA: Diagnosis not present

## 2020-03-12 DIAGNOSIS — Z1331 Encounter for screening for depression: Secondary | ICD-10-CM | POA: Diagnosis not present

## 2020-03-12 DIAGNOSIS — E785 Hyperlipidemia, unspecified: Secondary | ICD-10-CM | POA: Diagnosis not present

## 2020-03-12 DIAGNOSIS — Z0000 Encounter for general adult medical examination without abnormal findings: Secondary | ICD-10-CM | POA: Diagnosis not present

## 2020-03-12 DIAGNOSIS — R82998 Other abnormal findings in urine: Secondary | ICD-10-CM | POA: Diagnosis not present

## 2020-03-13 DIAGNOSIS — Z1212 Encounter for screening for malignant neoplasm of rectum: Secondary | ICD-10-CM | POA: Diagnosis not present

## 2020-04-23 ENCOUNTER — Other Ambulatory Visit: Payer: Self-pay | Admitting: Obstetrics & Gynecology

## 2020-04-23 DIAGNOSIS — Z1231 Encounter for screening mammogram for malignant neoplasm of breast: Secondary | ICD-10-CM

## 2020-05-16 ENCOUNTER — Other Ambulatory Visit: Payer: Self-pay

## 2020-05-16 ENCOUNTER — Ambulatory Visit
Admission: RE | Admit: 2020-05-16 | Discharge: 2020-05-16 | Disposition: A | Discharge status: Home / Self Care | Payer: BC Managed Care – PPO | Source: Ambulatory Visit | Attending: Obstetrics & Gynecology | Admitting: Obstetrics & Gynecology

## 2020-05-16 DIAGNOSIS — Z1231 Encounter for screening mammogram for malignant neoplasm of breast: Secondary | ICD-10-CM

## 2020-05-16 DIAGNOSIS — Z20828 Contact with and (suspected) exposure to other viral communicable diseases: Secondary | ICD-10-CM | POA: Diagnosis not present

## 2020-06-24 IMAGING — MG DIGITAL SCREENING BILAT W/ TOMO W/ CAD
8 series · 8 of 24 positions shown · non-contrast
Comparison: Previous exam(s).

CLINICAL DATA: Screening.

EXAM:
DIGITAL SCREENING BILATERAL MAMMOGRAM WITH TOMO AND CAD

[L MLO synth-2D]
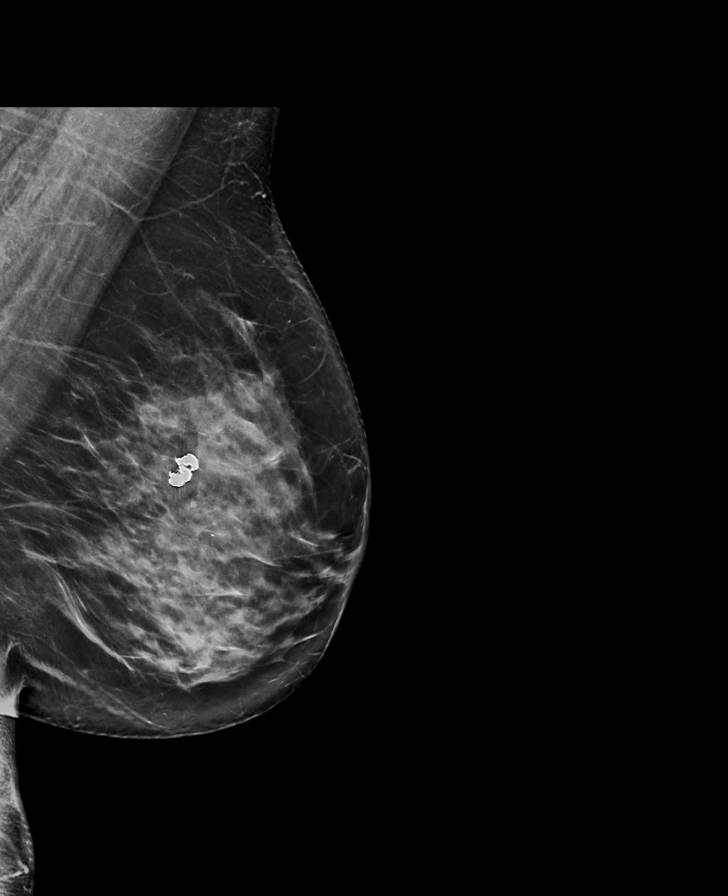

[R CC synth-2D]
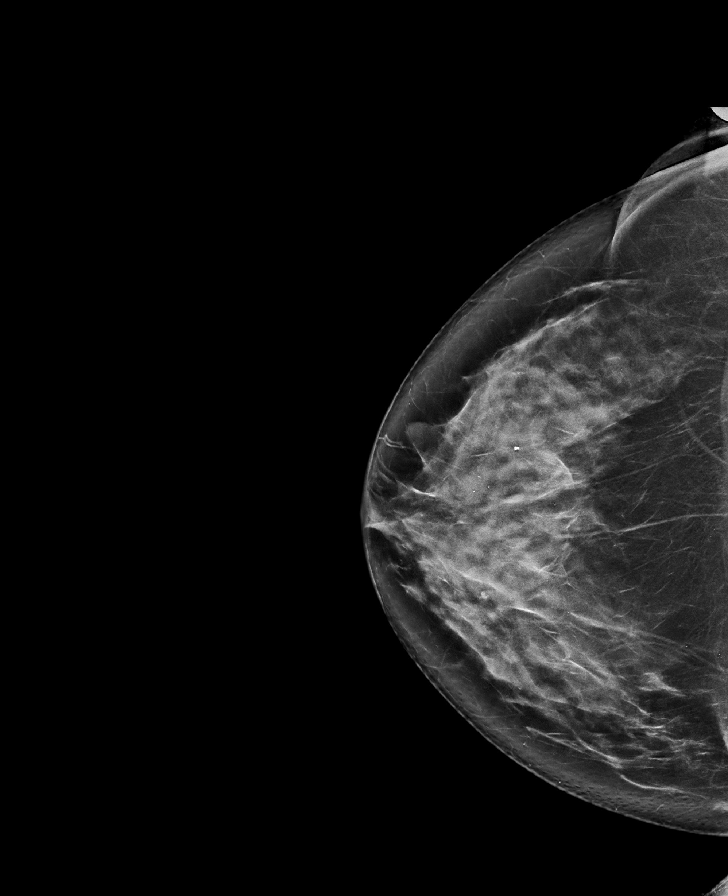

[L CC synth-2D]
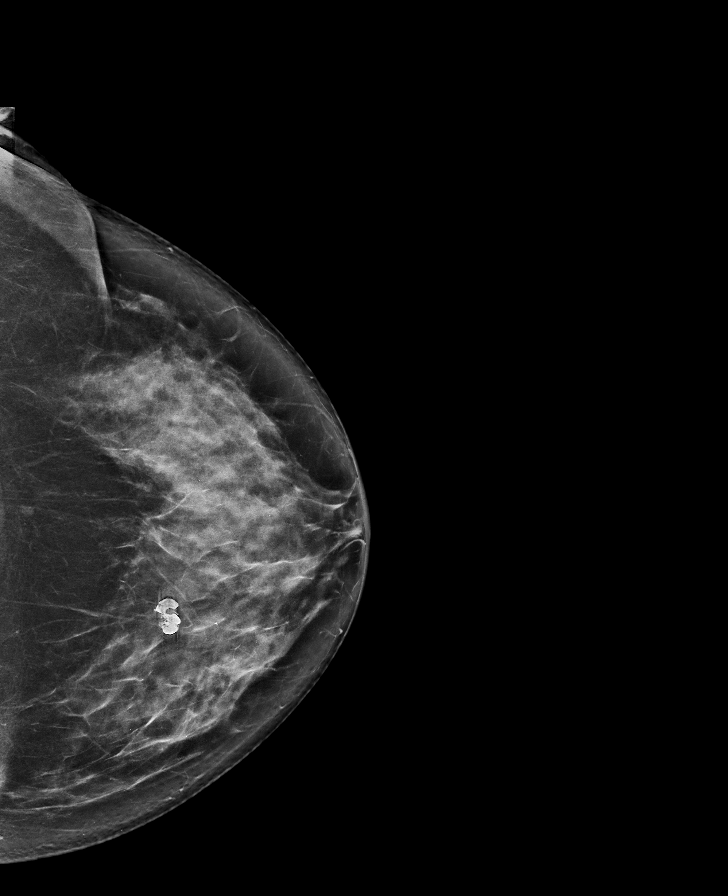

[R MLO synth-2D]
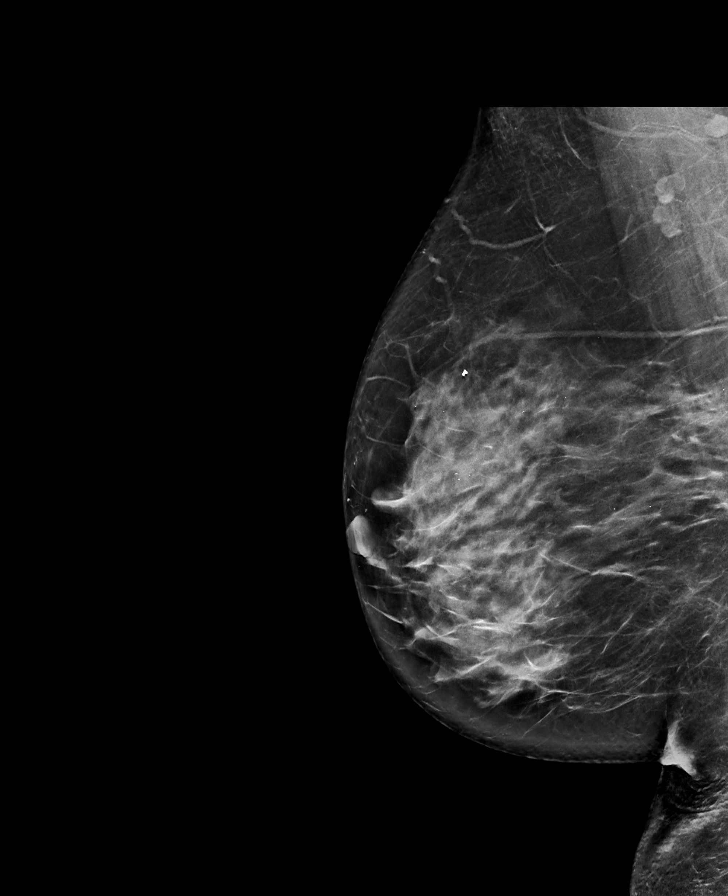

[L CC tomo · tomo slice 41/82.0]
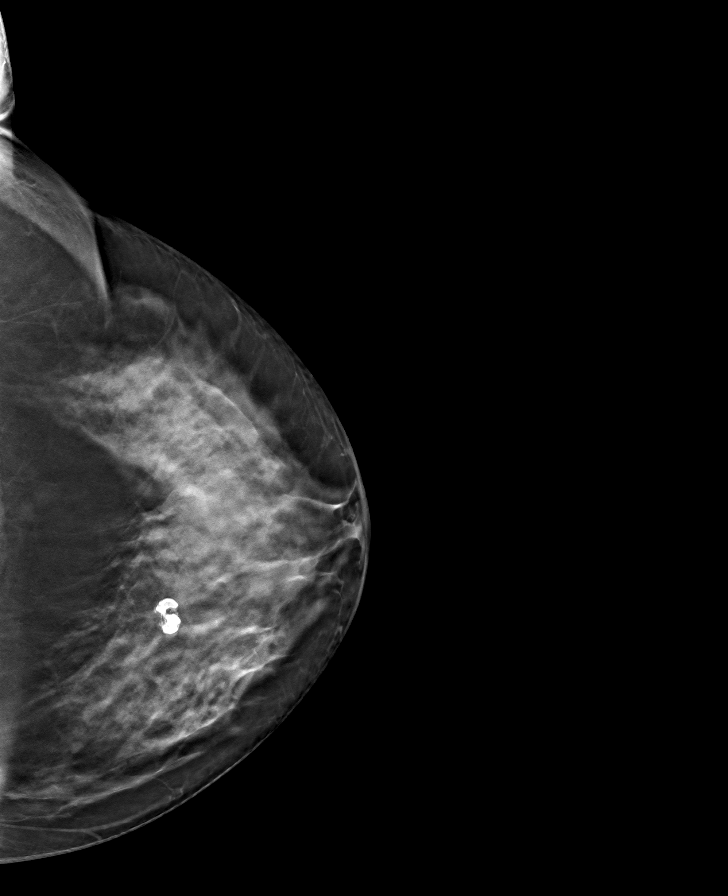

[R MLO tomo · tomo slice 43/86.0]
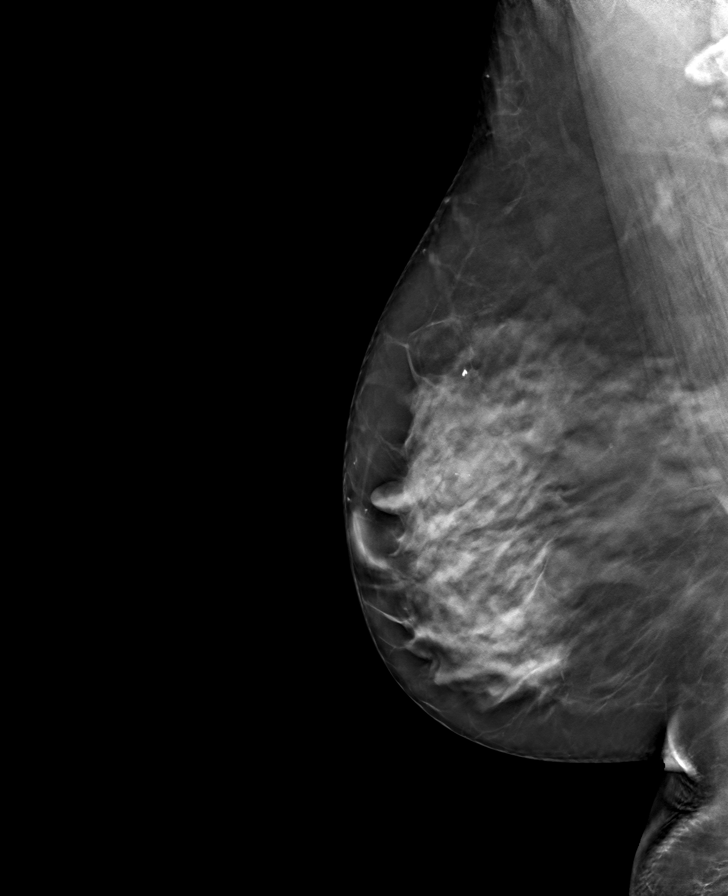

[R CC tomo · tomo slice 43/85.0]
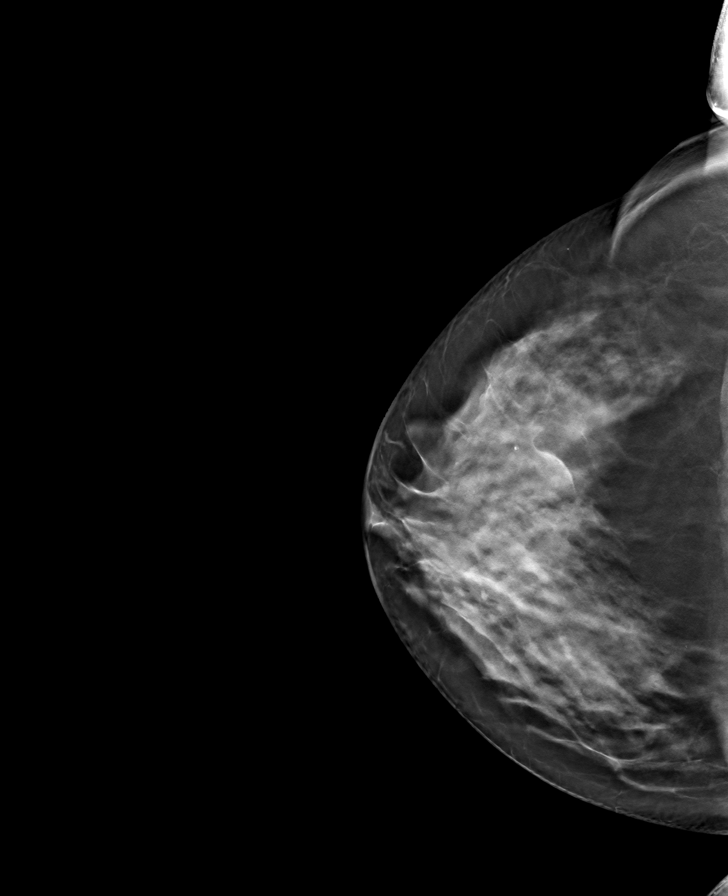

[L MLO tomo · tomo slice 43/85.0]
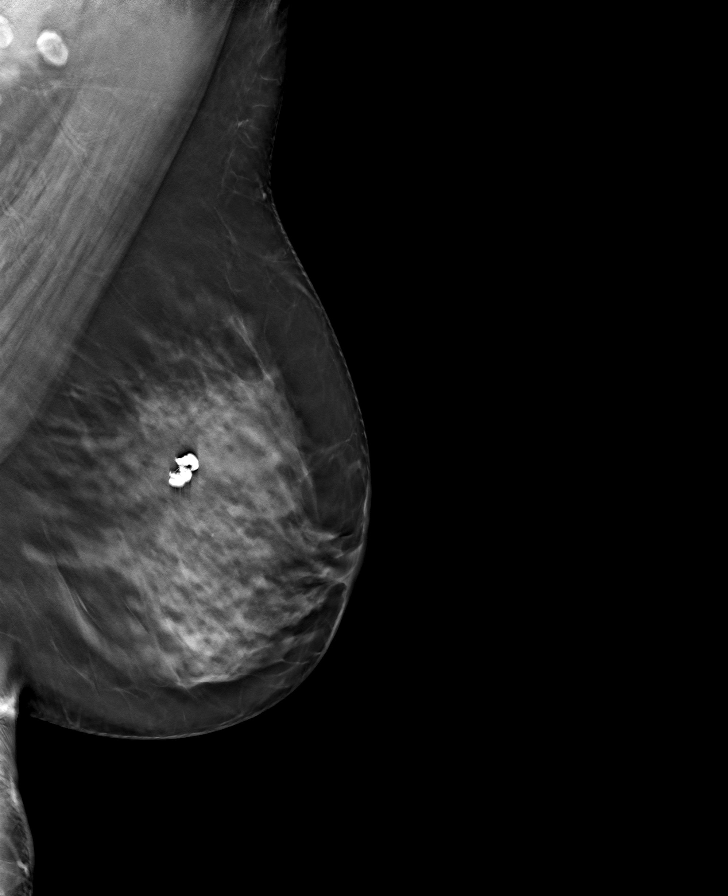

[8 of 24 positions shown; findings below may reference images not displayed]

ACR Breast Density Category d: The breast tissue is extremely dense,
which lowers the sensitivity of mammography
FINDINGS: There are no findings suspicious for malignancy. Images were
processed with CAD.
IMPRESSION: No mammographic evidence of malignancy. A result letter of this
screening mammogram will be mailed directly to the patient.

RECOMMENDATION:
Screening mammogram in one year. (Code:WO-0-ZI0)

BI-RADS CATEGORY  1: Negative.

## 2020-06-27 DIAGNOSIS — Z23 Encounter for immunization: Secondary | ICD-10-CM | POA: Diagnosis not present

## 2020-07-30 DIAGNOSIS — Z23 Encounter for immunization: Secondary | ICD-10-CM | POA: Diagnosis not present

## 2020-09-03 DIAGNOSIS — K2970 Gastritis, unspecified, without bleeding: Secondary | ICD-10-CM | POA: Diagnosis not present

## 2020-11-28 DIAGNOSIS — H5213 Myopia, bilateral: Secondary | ICD-10-CM | POA: Diagnosis not present

## 2020-11-28 DIAGNOSIS — H40053 Ocular hypertension, bilateral: Secondary | ICD-10-CM | POA: Diagnosis not present

## 2021-02-13 DIAGNOSIS — Z01419 Encounter for gynecological examination (general) (routine) without abnormal findings: Secondary | ICD-10-CM | POA: Diagnosis not present

## 2021-02-13 DIAGNOSIS — Z6833 Body mass index (BMI) 33.0-33.9, adult: Secondary | ICD-10-CM | POA: Diagnosis not present

## 2021-03-10 DIAGNOSIS — M65312 Trigger thumb, left thumb: Secondary | ICD-10-CM | POA: Diagnosis not present

## 2021-03-10 DIAGNOSIS — M79645 Pain in left finger(s): Secondary | ICD-10-CM | POA: Diagnosis not present

## 2021-04-04 DIAGNOSIS — E039 Hypothyroidism, unspecified: Secondary | ICD-10-CM | POA: Diagnosis not present

## 2021-04-04 DIAGNOSIS — E785 Hyperlipidemia, unspecified: Secondary | ICD-10-CM | POA: Diagnosis not present

## 2021-04-11 DIAGNOSIS — E785 Hyperlipidemia, unspecified: Secondary | ICD-10-CM | POA: Diagnosis not present

## 2021-04-11 DIAGNOSIS — Z1212 Encounter for screening for malignant neoplasm of rectum: Secondary | ICD-10-CM | POA: Diagnosis not present

## 2021-04-11 DIAGNOSIS — R82998 Other abnormal findings in urine: Secondary | ICD-10-CM | POA: Diagnosis not present

## 2021-04-11 DIAGNOSIS — Z0000 Encounter for general adult medical examination without abnormal findings: Secondary | ICD-10-CM | POA: Diagnosis not present

## 2021-05-28 DIAGNOSIS — H40053 Ocular hypertension, bilateral: Secondary | ICD-10-CM | POA: Diagnosis not present

## 2021-05-29 ENCOUNTER — Other Ambulatory Visit: Payer: Self-pay | Admitting: Obstetrics & Gynecology

## 2021-05-29 ENCOUNTER — Other Ambulatory Visit: Payer: Self-pay | Admitting: *Deleted

## 2021-05-29 DIAGNOSIS — Z1231 Encounter for screening mammogram for malignant neoplasm of breast: Secondary | ICD-10-CM

## 2021-07-06 ENCOUNTER — Encounter: Payer: Self-pay | Admitting: Internal Medicine

## 2021-07-22 ENCOUNTER — Other Ambulatory Visit: Payer: Self-pay

## 2021-07-22 ENCOUNTER — Ambulatory Visit
Admission: RE | Admit: 2021-07-22 | Discharge: 2021-07-22 | Disposition: A | Discharge status: Home / Self Care | Payer: BC Managed Care – PPO | Source: Ambulatory Visit | Attending: Obstetrics & Gynecology | Admitting: Obstetrics & Gynecology

## 2021-07-22 DIAGNOSIS — Z1231 Encounter for screening mammogram for malignant neoplasm of breast: Secondary | ICD-10-CM

## 2021-11-28 DIAGNOSIS — H40053 Ocular hypertension, bilateral: Secondary | ICD-10-CM | POA: Diagnosis not present

## 2022-03-04 DIAGNOSIS — B078 Other viral warts: Secondary | ICD-10-CM | POA: Diagnosis not present

## 2022-03-04 DIAGNOSIS — L570 Actinic keratosis: Secondary | ICD-10-CM | POA: Diagnosis not present

## 2022-03-04 DIAGNOSIS — Z1283 Encounter for screening for malignant neoplasm of skin: Secondary | ICD-10-CM | POA: Diagnosis not present

## 2022-03-04 DIAGNOSIS — X32XXXD Exposure to sunlight, subsequent encounter: Secondary | ICD-10-CM | POA: Diagnosis not present

## 2022-03-04 DIAGNOSIS — D485 Neoplasm of uncertain behavior of skin: Secondary | ICD-10-CM | POA: Diagnosis not present

## 2022-03-04 DIAGNOSIS — D225 Melanocytic nevi of trunk: Secondary | ICD-10-CM | POA: Diagnosis not present

## 2022-03-24 DIAGNOSIS — N952 Postmenopausal atrophic vaginitis: Secondary | ICD-10-CM | POA: Diagnosis not present

## 2022-03-24 DIAGNOSIS — Z6834 Body mass index (BMI) 34.0-34.9, adult: Secondary | ICD-10-CM | POA: Diagnosis not present

## 2022-03-24 DIAGNOSIS — Z124 Encounter for screening for malignant neoplasm of cervix: Secondary | ICD-10-CM | POA: Diagnosis not present

## 2022-03-24 DIAGNOSIS — Z01419 Encounter for gynecological examination (general) (routine) without abnormal findings: Secondary | ICD-10-CM | POA: Diagnosis not present

## 2022-03-24 DIAGNOSIS — Z13820 Encounter for screening for osteoporosis: Secondary | ICD-10-CM | POA: Diagnosis not present

## 2022-04-02 ENCOUNTER — Emergency Department (HOSPITAL_COMMUNITY): Payer: BC Managed Care – PPO

## 2022-04-02 ENCOUNTER — Encounter (HOSPITAL_COMMUNITY): Payer: Self-pay | Admitting: *Deleted

## 2022-04-02 ENCOUNTER — Other Ambulatory Visit: Payer: Self-pay

## 2022-04-02 ENCOUNTER — Emergency Department (HOSPITAL_COMMUNITY)
Admission: EM | Admit: 2022-04-02 | Discharge: 2022-04-02 | Disposition: A | Discharge status: Home / Self Care | Payer: BC Managed Care – PPO | Attending: Emergency Medicine | Admitting: Emergency Medicine

## 2022-04-02 DIAGNOSIS — I700 Atherosclerosis of aorta: Secondary | ICD-10-CM | POA: Diagnosis not present

## 2022-04-02 DIAGNOSIS — R319 Hematuria, unspecified: Secondary | ICD-10-CM | POA: Insufficient documentation

## 2022-04-02 DIAGNOSIS — R110 Nausea: Secondary | ICD-10-CM | POA: Insufficient documentation

## 2022-04-02 DIAGNOSIS — R109 Unspecified abdominal pain: Secondary | ICD-10-CM | POA: Diagnosis not present

## 2022-04-02 DIAGNOSIS — R1031 Right lower quadrant pain: Secondary | ICD-10-CM | POA: Diagnosis not present

## 2022-04-02 HISTORY — DX: Pure hypercholesterolemia, unspecified: E78.00

## 2022-04-02 LAB — CBC WITH DIFFERENTIAL/PLATELET
Abs Immature Granulocytes: 0.02 10*3/uL (ref 0.00–0.07)
Basophils Absolute: 0 10*3/uL (ref 0.0–0.1)
Basophils Relative: 0 %
Eosinophils Absolute: 0.2 10*3/uL (ref 0.0–0.5)
Eosinophils Relative: 2 %
HCT: 42.4 % (ref 36.0–46.0)
Hemoglobin: 13.8 g/dL (ref 12.0–15.0)
Immature Granulocytes: 0 %
Lymphocytes Relative: 22 %
Lymphs Abs: 1.8 10*3/uL (ref 0.7–4.0)
MCH: 29.3 pg (ref 26.0–34.0)
MCHC: 32.5 g/dL (ref 30.0–36.0)
MCV: 90 fL (ref 80.0–100.0)
Monocytes Absolute: 0.7 10*3/uL (ref 0.1–1.0)
Monocytes Relative: 9 %
Neutro Abs: 5.3 10*3/uL (ref 1.7–7.7)
Neutrophils Relative %: 67 %
Platelets: 248 10*3/uL (ref 150–400)
RBC: 4.71 MIL/uL (ref 3.87–5.11)
RDW: 12.3 % (ref 11.5–15.5)
WBC: 8.1 10*3/uL (ref 4.0–10.5)
nRBC: 0 % (ref 0.0–0.2)

## 2022-04-02 LAB — URINALYSIS, ROUTINE W REFLEX MICROSCOPIC
Bilirubin Urine: NEGATIVE
Glucose, UA: NEGATIVE mg/dL
Ketones, ur: NEGATIVE mg/dL
Leukocytes,Ua: NEGATIVE
Nitrite: NEGATIVE
Protein, ur: NEGATIVE mg/dL
Specific Gravity, Urine: 1.019 (ref 1.005–1.030)
pH: 5 (ref 5.0–8.0)

## 2022-04-02 LAB — COMPREHENSIVE METABOLIC PANEL
ALT: 30 U/L (ref 0–44)
AST: 36 U/L (ref 15–41)
Albumin: 4.3 g/dL (ref 3.5–5.0)
Alkaline Phosphatase: 86 U/L (ref 38–126)
Anion gap: 8 (ref 5–15)
BUN: 11 mg/dL (ref 6–20)
CO2: 28 mmol/L (ref 22–32)
Calcium: 10 mg/dL (ref 8.9–10.3)
Chloride: 102 mmol/L (ref 98–111)
Creatinine, Ser: 0.92 mg/dL (ref 0.44–1.00)
GFR, Estimated: >60 mL/min (ref >60)
Glucose, Bld: 116 mg/dL — ABNORMAL HIGH (ref 70–99)
Potassium: 3.9 mmol/L (ref 3.5–5.1)
Sodium: 138 mmol/L (ref 135–145)
Total Bilirubin: 0.9 mg/dL (ref 0.3–1.2)
Total Protein: 7.5 g/dL (ref 6.5–8.1)

## 2022-04-02 LAB — LIPASE, BLOOD: Lipase: 27 U/L (ref 11–51)

## 2022-04-02 MED ORDER — IOHEXOL 300 MG/ML  SOLN
100.0000 mL | Freq: Once | INTRAMUSCULAR | Status: AC | PRN
  Administered 2022-04-02: 100 mL via INTRAVENOUS

## 2022-04-02 NOTE — Discharge Instructions (Signed)
You have been evaluated for your symptoms.  Fortunately your labs are reassuring except there is some evidence of blood in your urine.  It is worthwhile to follow-up with [facility] for outpatient evaluation to ensure this is not an ongoing symptom.  Return to the ER if his symptoms worsen. ?

## 2022-04-02 NOTE — ED Provider Triage Note (Signed)
Emergency Medicine Provider Triage Evaluation Note ? ?Cynthia Flowers , a 56 y.o. female  was evaluated in triage.  Pt complains of right-sided abdominal pain.  Reports history of abdominal adhesions which were surgically removed.  This occurred 30 years ago.  Otherwise denies abdominal surgeries.  States pain last night was worse than the pain with the adhesions.  Endorses nausea but denies vomiting, dysuria, hematuria.  Radiated to her back last night. ? ?Review of Systems  ?Positive: As above ?Negative: As above ? ?Physical Exam  ?BP (!) 154/90 (BP Location: Right Arm)   Pulse 86   Temp 98.7 ?F (37.1 ?C) (Oral)   Resp 16   LMP 04/04/2011   SpO2 98%  ?Gen:   Awake, no distress   ?Resp:  Normal effort  ?MSK:   Moves extremities without difficulty  ?Other:   ? ?Medical Decision Making  ?Medically screening exam initiated at 12:42 PM.  Appropriate orders placed.  Cynthia Flowers was informed that the remainder of the evaluation will be completed by another provider, this initial triage assessment does not replace that evaluation, and the importance of remaining in the ED until their evaluation is complete. ? ? ?  ?Evlyn Courier, PA-C ?04/02/22 1253 ? ?

## 2022-04-02 NOTE — ED Triage Notes (Signed)
Pt reports having right side abd pain since last night. Had nausea, no vomiting or diarrhea. Last bm was today and normal. Denies urinary symptoms.  ?

## 2022-04-02 NOTE — ED Provider Notes (Signed)
?[facility] ?Provider Note ? ? ?CSN: [id number] ?Arrival date & time: 04/02/22  1211 ? ?  ? ?History ? ?Chief Complaint  ?Patient presents with  ? Abdominal Pain  ? ? ?Cynthia Flowers is a 56 y.o. female. ? ?The history is provided by the patient and medical records. No language interpreter was used.  ?Abdominal Pain ? ?56 year old female presenting with complaints of flank pain.  Patient reports she developed pain to the right side of her abdomen that started last night and extended throughout the day today.  Described pain as a sharp stabbing sensation initially with bouts of nausea but no vomiting.  Pain has since subsided.  She still has an appetite.  Her last bowel movement today was normal.  She denies any trouble urinating no blood in her urine no vaginal bleeding or vaginal discharge.  She denies having fever or chills no chest pain or shortness of breath.  No history of kidney stone.  No specific treatment tried.  Pain is minimal at this time. ? ?Home Medications ?Prior to Admission medications   ?Medication Sig Start Date End Date Taking? Authorizing Provider  ?levothyroxine (SYNTHROID, LEVOTHROID) 75 MCG tablet Take 75 mcg by mouth daily before breakfast.    [provider]  ?   ? ?Allergies    ?Patient has no known allergies.   ? ?Review of Systems   ?Review of Systems  ?Gastrointestinal:  Positive for abdominal pain.  ?All other systems reviewed and are negative. ? ?Physical Exam ?Updated Vital Signs ?BP (!) 154/90 (BP Location: Right Arm)   Pulse 86   Temp 98.7 ?F (37.1 ?C) (Oral)   Resp 16   LMP 04/04/2011   SpO2 98%  ?Physical Exam ?Vitals and nursing note reviewed.  ?Constitutional:   ?   General: She is not in acute distress. ?   Appearance: She is well-developed.  ?HENT:  ?   Head: Atraumatic.  ?Eyes:  ?   Conjunctiva/sclera: Conjunctivae normal.  ?Cardiovascular:  ?   Rate and Rhythm: Normal rate and regular rhythm.  ?Pulmonary:  ?   Effort:  Pulmonary effort is normal.  ?Abdominal:  ?   General: Abdomen is flat. Bowel sounds are normal.  ?   Palpations: Abdomen is soft.  ?   Tenderness: There is no abdominal tenderness. There is no right CVA tenderness or left CVA tenderness.  ?Musculoskeletal:  ?   Cervical back: Neck supple.  ?Skin: ?   Findings: No rash.  ?Neurological:  ?   Mental Status: She is alert.  ?Psychiatric:     ?   Mood and Affect: Mood normal.  ? ? ?ED Results / Procedures / Treatments   ?Labs ?(all labs ordered are listed, but only abnormal results are displayed) ?Labs Reviewed  ?COMPREHENSIVE METABOLIC PANEL - Abnormal; Notable for the following components:  ?    Result Value  ? Glucose, Bld 116 (*)   ? All other components within normal limits  ?URINALYSIS, ROUTINE W REFLEX MICROSCOPIC - Abnormal; Notable for the following components:  ? Hgb urine dipstick MODERATE (*)   ? Bacteria, UA RARE (*)   ? All other components within normal limits  ?CBC WITH DIFFERENTIAL/PLATELET  ?LIPASE, BLOOD  ? ? ?EKG ?None ? ?Radiology ?CT ABDOMEN PELVIS W CONTRAST ? ?Result Date: 04/02/2022 ?CLINICAL DATA:  Abdominal pain, acute, nonlocalized EXAM: CT ABDOMEN AND PELVIS WITH CONTRAST TECHNIQUE: Multidetector CT imaging of the abdomen and pelvis was performed using the standard protocol  following bolus administration of intravenous contrast. RADIATION DOSE REDUCTION: This exam was performed according to the departmental dose-optimization program which includes automated exposure control, adjustment of the mA and/or kV according to patient size and/or use of iterative reconstruction technique. CONTRAST:  185m OMNIPAQUE IOHEXOL 300 MG/ML  SOLN COMPARISON:  None Available. FINDINGS: Lower chest: No acute abnormality. Hepatobiliary: No focal liver abnormality is seen. No gallstones, gallbladder wall thickening, or biliary dilatation. Pancreas: Unremarkable. No pancreatic ductal dilatation or surrounding inflammatory changes. Spleen: Normal in size without  focal abnormality. Adrenals/Urinary Tract: Normal appearance of the adrenal glands. No hydronephrosis or calculi. Unremarkable bladder. Area of hypoenhancement within the peripheral aspect of the interpolar right kidney (for example see series 3, image 28 (series 6, image 109) Stomach/Bowel: Similar the bowel loops are closely apposed to the anterior aspect of the abdominal wall, compatible with reported history of adhesions. No dilated bowel loops to suggest obstruction. No bowel wall thickening to suggest inflammation. Normal appearance of the appendix. Vascular/Lymphatic: Aortic atherosclerosis. No enlarged abdominal or pelvic lymph nodes. Reproductive: Calcifications, potentially related to fibroids. No adnexal or ovarian mass. Other: Small fat containing umbilical hernia.  No free fluid. 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 IMPRESSION: 1. Area of hypoenhancement within the peripheral aspect of the interpolar right kidney, concerning for pyelonephritis. Renal infarct is a less likely differential consideration. Recommend correlation with urinalysis. 2. Possible bowel adhesions without evidence of obstruction. Electronically Signed   By: FMargaretha SheffieldM.D.   On: 04/02/2022 16:18   ? ?Procedures ?Procedures  ? ? ?Medications Ordered in ED ?Medications  ?iohexol (OMNIPAQUE) 300 MG/ML solution 100 mL (100 mLs Intravenous Contrast Given 04/02/22 1607)  ? ? ?ED Course/ Medical Decision Making/ A&P ?  ?                        ?Medical Decision Making ? ?BP (!) 151/95   Pulse 71   Temp 98.7 ?F (37.1 ?C) (Oral)   Resp 20   LMP 04/04/2011   SpO2 100%  ? ?3:30 PM ?This is a 56year old female with significant history of hypercholesterolemia presenting with complaint of abdominal pain.  She endorsed right-sided abdominal pain that started last night and spent throughout the day today with some associated nausea but no vomiting or diarrhea.  She has normal bowel  movement.  She denies any urinary symptoms and no hematuria.  She states that her pain has mostly subsided.  She has an appetite.  On exam she does not have any CVA tenderness and her abdomen is soft and nontender.  Labs obtained and independently viewed interpreted by me.  Urinalysis shows moderate hemoglobin on urine dipsticks without signs of urinary tract infection.  This finding suggest that they could be a kidney stones that cause hematuria.  Normal WBC normal H&H, electrolyte panels are reassuring, normal lipase.  Will obtain abdominal pelvis CT scan for further assessment.  I offered pain medication at this time patient declined.  Will monitor closely.  Patient is currently resting comfortably and appears to be in no acute distress. ? ?4:43 PM ?CT scan of the abdomen pelvis which was independently visualized.  Patient has an area of hypoenhancement in the peripheral aspect of the interpolar right kidney concerning for pyelonephritis.  Renal function is less likely differential consideration.  Possible bowel adhesion without evidence of obstruction. ? ?Fortunately urinalysis is without signs of of UTI therefore have low suspicion for pyelonephritis.  Patient does  have blood in the urine and I think will be reasonable for her to follow-up with urologist for further outpatient assessment.  Given no acute finding and the patient is relatively pain-free, plan to discharge patient. ? ? ?This patient presents to the ED for concern of abd pain, this involves an extensive number of treatment options, and is a complaint that carries with it a high risk of complications and morbidity.  The differential diagnosis includes kidney stone, appendicitis, colitis, diverticulitis, gastritis, cholecystitis, UTI, pyelonephritis ? ?Co morbidities that complicate the patient evaluation ?hypercholesterolemia ?Additional history obtained: ? ?Additional history obtained from family member ?External records from outside source  obtained and reviewed including prior notes from GI ? ?Lab Tests: ? ?I Ordered, and personally interpreted labs.  The pertinent results include:  as above ? ?Imaging Studies ordered: ? ?I ordered imaging studies including abd/pelvis

## 2022-04-02 NOTE — ED Notes (Signed)
Got patient into a gown on the monitor patient is resting with call bell in reach 

## 2022-04-10 DIAGNOSIS — E785 Hyperlipidemia, unspecified: Secondary | ICD-10-CM | POA: Diagnosis not present

## 2022-04-13 DIAGNOSIS — Z0000 Encounter for general adult medical examination without abnormal findings: Secondary | ICD-10-CM | POA: Diagnosis not present

## 2022-04-14 ENCOUNTER — Encounter: Payer: Self-pay | Admitting: Internal Medicine

## 2022-04-15 DIAGNOSIS — I4891 Unspecified atrial fibrillation: Secondary | ICD-10-CM | POA: Diagnosis not present

## 2022-04-15 DIAGNOSIS — Z0000 Encounter for general adult medical examination without abnormal findings: Secondary | ICD-10-CM | POA: Diagnosis not present

## 2022-04-15 DIAGNOSIS — R82998 Other abnormal findings in urine: Secondary | ICD-10-CM | POA: Diagnosis not present

## 2022-04-15 DIAGNOSIS — K2970 Gastritis, unspecified, without bleeding: Secondary | ICD-10-CM | POA: Diagnosis not present

## 2022-04-21 ENCOUNTER — Ambulatory Visit (HOSPITAL_COMMUNITY)
Admission: RE | Admit: 2022-04-21 | Discharge: 2022-04-21 | Disposition: A | Discharge status: Home / Self Care | Payer: BC Managed Care – PPO | Source: Ambulatory Visit | Attending: Physician Assistant | Admitting: Physician Assistant

## 2022-04-21 ENCOUNTER — Encounter (HOSPITAL_COMMUNITY): Payer: Self-pay | Admitting: Physician Assistant

## 2022-04-21 VITALS — BP 140/100 | HR 129 | Ht 66.0 in | Wt 203.0 lb

## 2022-04-21 DIAGNOSIS — R002 Palpitations: Secondary | ICD-10-CM | POA: Diagnosis not present

## 2022-04-21 DIAGNOSIS — G4730 Sleep apnea, unspecified: Secondary | ICD-10-CM | POA: Diagnosis not present

## 2022-04-21 DIAGNOSIS — R0683 Snoring: Secondary | ICD-10-CM | POA: Diagnosis not present

## 2022-04-21 DIAGNOSIS — Z823 Family history of stroke: Secondary | ICD-10-CM | POA: Insufficient documentation

## 2022-04-21 DIAGNOSIS — Z6832 Body mass index (BMI) 32.0-32.9, adult: Secondary | ICD-10-CM | POA: Diagnosis not present

## 2022-04-21 DIAGNOSIS — E669 Obesity, unspecified: Secondary | ICD-10-CM | POA: Insufficient documentation

## 2022-04-21 DIAGNOSIS — I480 Paroxysmal atrial fibrillation: Secondary | ICD-10-CM | POA: Insufficient documentation

## 2022-04-21 DIAGNOSIS — Z7182 Exercise counseling: Secondary | ICD-10-CM | POA: Insufficient documentation

## 2022-04-21 DIAGNOSIS — E039 Hypothyroidism, unspecified: Secondary | ICD-10-CM | POA: Diagnosis not present

## 2022-04-21 DIAGNOSIS — E785 Hyperlipidemia, unspecified: Secondary | ICD-10-CM | POA: Diagnosis not present

## 2022-04-21 NOTE — Progress Notes (Signed)
Primary Care Physician: Prince Solian, MD Primary Cardiologist: none Primary Electrophysiologist: none Referring Physician: Dr Leone Brand is a 56 y.o. female with a history of HLD, hypothyroidism, atrial fibrillation who presents for consultation in the [facility].  The patient was initially diagnosed with atrial fibrillation 04/15/22 after presenting to her PCP for a routine visit. She admits she has had palpitations off and on for about 3 years. She has several family members with afib, her mother is a patient of Dr Curt Bears. There does not seem to be any particular trigger for her palpitations. Patient has a CHADS2VASC score of 1. She does admit to snoring and witnessed apnea. She reports that she went back into afib last night but admits she is not sure how much afib she is really having because she has "gotten used to it".   Today, she denies symptoms of chest pain, shortness of breath, orthopnea, PND, lower extremity edema, dizziness, presyncope, syncope, snoring, daytime somnolence, bleeding, or neurologic sequela. The patient is tolerating medications without difficulties and is otherwise without complaint today.    Atrial Fibrillation Risk Factors:  she does have symptoms or diagnosis of sleep apnea. she is agreeable for sleep study.  she does not have a history of rheumatic fever. she does not have a history of alcohol use. The patient does have a history of early familial atrial fibrillation or other arrhythmias. Many family members have afib.  she has a BMI of Body mass index is 32.77 kg/m.Marland Kitchen Filed Weights   04/21/22 1459  Weight: 92.1 kg    Family History  Problem Relation Age of Onset   Heart disease Mother    Colon polyps Mother    Prostate cancer Father    Dementia Father    Breast cancer Maternal Aunt        in 20's   Diabetes Paternal Grandmother    Stroke Maternal Grandmother    Breast cancer Maternal Aunt        in  59's   Colon cancer Neg Hx      Atrial Fibrillation Management history:  Previous antiarrhythmic drugs: none Previous cardioversions: none Previous ablations: none CHADS2VASC score: 1 Anticoagulation history: none   Past Medical History:  Diagnosis Date   High cholesterol    History of migraine    Hypothyroidism    Mitral valve prolapse    mild   Past Surgical History:  Procedure Laterality Date   BREAST BIOPSY Right 2013   BREAST BIOPSY Left 2002   eyelid surgery Right 2016   LAPAROSCOPIC ABDOMINAL EXPLORATION  1994   lysis of adhesions   TMJ ARTHROSCOPY  1986    Current Outpatient Medications  Medication Sig Dispense Refill   levothyroxine (SYNTHROID, LEVOTHROID) 75 MCG tablet Take 75 mcg by mouth daily before breakfast.     Multiple Vitamin (MULTI-VITAMIN DAILY PO) Take one tablet by mouth every day Oral     pantoprazole (PROTONIX) 40 MG tablet Take 40 mg by mouth daily.     rosuvastatin (CRESTOR) 10 MG tablet Take 10 mg by mouth daily.     rivaroxaban (XARELTO) 20 MG TABS tablet 1 tablet with food Orally Once a day for 90 days (Patient not taking: Reported on 04/21/2022)     No current facility-administered medications for this encounter.    No Known Allergies  Social History   Socioeconomic History   Marital status: Married    Spouse name: Not on file  Number of children: Not on file   Years of education: Not on file   Highest education level: Not on file  Occupational History   Not on file  Tobacco Use   Smoking status: Never   Smokeless tobacco: Never   Tobacco comments:    Never smoke 04/21/22  Substance and Sexual Activity   Alcohol use: Yes    Alcohol/week: 2.0 - 4.0 standard drinks    Types: 2 - 4 Standard drinks or equivalent per week    Comment: 1-2 mixed drinks 1-2 a month 04/21/22   Drug use: No   Sexual activity: Not on file  Other Topics Concern   Not on file  Social History Narrative   Not on file   Social Determinants of  Health   Financial Resource Strain: Not on file  Food Insecurity: Not on file  Transportation Needs: Not on file  Physical Activity: Not on file  Stress: Not on file  Social Connections: Not on file  Intimate Partner Violence: Not on file     ROS- All systems are reviewed and negative except as per the HPI above.  Physical Exam: Vitals:   04/21/22 1459  BP: (!) 140/100  Pulse: (!) 129  Weight: 92.1 kg  Height: '5\' 6"'$  (1.676 m)    GEN- The patient is a well appearing obese female, alert and oriented x 3 today.   Head- normocephalic, atraumatic Eyes-  Sclera clear, conjunctiva pink Ears- hearing intact Oropharynx- clear Neck- supple  Lungs- Clear to ausculation bilaterally, normal work of breathing Heart- irregular rate and rhythm, no murmurs, rubs or gallops  GI- soft, NT, ND, + BS Extremities- no clubbing, cyanosis, or edema MS- no significant deformity or atrophy Skin- no rash or lesion Psych- euthymic mood, full affect Neuro- strength and sensation are intact  Wt Readings from Last 3 Encounters:  04/21/22 92.1 kg  02/06/16 79.8 kg  01/23/16 79.8 kg    EKG today demonstrates  Afib with RVR Vent. rate 129 BPM PR interval * ms QRS duration 82 ms QT/QTcB 320/468 ms  Epic records are reviewed at length today  CHA2DS2-VASc Score = 1  The patient's score is based upon: CHF History: 0 HTN History: 0 Diabetes History: 0 Stroke History: 0 Vascular Disease History: 0 Age Score: 0 Gender Score: 1       ASSESSMENT AND PLAN: 1. Paroxysmal atrial fibrillation  The patient's CHA2DS2-VASc score is 1, indicating a 0.6% annual risk of stroke.   General education about afib provided and questions answered. We also discussed her stroke risk and the risks and benefits of anticoagulation. Heart rate improved on auscultation.  Patient unsure about if she is persistent or paroxysmal based on her symptoms. Will have her wear a 7 day Zio monitor to evaluate arrhythmia  burden. If she is persistent, would start anticoagulation and plan for DCCV. If she is paroxsymal, she would not require anticoagulation with a low CV score.  Check echocardiogram We discussed rhythm control options long term including AAD and ablation.   2. Obesity Body mass index is 32.77 kg/m. Lifestyle modification was discussed at length including regular exercise and weight reduction.  3. Snoring/witnessed apnea The importance of adequate treatment of sleep apnea was discussed today in order to improve our ability to maintain sinus rhythm long term. Will refer for sleep study.    Follow up in the AF clinic in 2 weeks.    [facility] [address]  [place], [place] [address] [phone] 04/21/2022 3:08 PM

## 2022-04-23 ENCOUNTER — Telehealth: Payer: Self-pay | Admitting: *Deleted

## 2022-04-23 NOTE — Telephone Encounter (Signed)
Pt has Xarelto on her med list- called pt to verify- pt states she is in A Fib- has been for ~ 3 yrs- she saw her PCP who referred her to the [facility] and prescribed Xarelto- pt has not started the xarelto as of 04-23-22 call- pt will follow up 6-13 with cardio- she has an Echo 6-13 then has OV- she plans to NOT start Xarelto prior to her 7-25 colon- she has been instructed to call after the 6-13 OV to update PV on what she was recommended to do per cardio- she is aware if she starts the Xarelto she will have to have an OV prior to her 7-25 colon

## 2022-05-05 ENCOUNTER — Ambulatory Visit (HOSPITAL_BASED_OUTPATIENT_CLINIC_OR_DEPARTMENT_OTHER)
Admission: RE | Admit: 2022-05-05 | Discharge: 2022-05-05 | Disposition: A | Discharge status: Home / Self Care | Payer: BC Managed Care – PPO | Source: Ambulatory Visit | Attending: Physician Assistant | Admitting: Physician Assistant

## 2022-05-05 ENCOUNTER — Ambulatory Visit (HOSPITAL_COMMUNITY)
Admission: RE | Admit: 2022-05-05 | Discharge: 2022-05-05 | Disposition: A | Discharge status: Home / Self Care | Payer: BC Managed Care – PPO | Source: Ambulatory Visit | Attending: Physician Assistant | Admitting: Physician Assistant

## 2022-05-05 ENCOUNTER — Telehealth: Payer: Self-pay | Admitting: *Deleted

## 2022-05-05 ENCOUNTER — Encounter (HOSPITAL_COMMUNITY): Payer: Self-pay | Admitting: Physician Assistant

## 2022-05-05 VITALS — BP 138/100 | HR 112 | Ht 66.0 in | Wt 204.4 lb

## 2022-05-05 DIAGNOSIS — I480 Paroxysmal atrial fibrillation: Secondary | ICD-10-CM | POA: Diagnosis not present

## 2022-05-05 DIAGNOSIS — Z6832 Body mass index (BMI) 32.0-32.9, adult: Secondary | ICD-10-CM | POA: Diagnosis not present

## 2022-05-05 DIAGNOSIS — E785 Hyperlipidemia, unspecified: Secondary | ICD-10-CM | POA: Insufficient documentation

## 2022-05-05 DIAGNOSIS — R0683 Snoring: Secondary | ICD-10-CM | POA: Diagnosis not present

## 2022-05-05 DIAGNOSIS — Z7901 Long term (current) use of anticoagulants: Secondary | ICD-10-CM | POA: Diagnosis not present

## 2022-05-05 DIAGNOSIS — Z7182 Exercise counseling: Secondary | ICD-10-CM | POA: Diagnosis not present

## 2022-05-05 DIAGNOSIS — I341 Nonrheumatic mitral (valve) prolapse: Secondary | ICD-10-CM | POA: Diagnosis not present

## 2022-05-05 DIAGNOSIS — E039 Hypothyroidism, unspecified: Secondary | ICD-10-CM | POA: Insufficient documentation

## 2022-05-05 DIAGNOSIS — E669 Obesity, unspecified: Secondary | ICD-10-CM | POA: Diagnosis not present

## 2022-05-05 DIAGNOSIS — I4819 Other persistent atrial fibrillation: Secondary | ICD-10-CM | POA: Insufficient documentation

## 2022-05-05 LAB — ECHOCARDIOGRAM COMPLETE
Calc EF: 56.5 %
S' Lateral: 2.9 cm
Single Plane A2C EF: 54.4 %
Single Plane A4C EF: 58.4 %

## 2022-05-05 MED ORDER — RIVAROXABAN 20 MG PO TABS: 20.0000 mg | ORAL_TABLET | Freq: Every day | ORAL | 3 refills | Status: DC

## 2022-05-05 MED ORDER — METOPROLOL SUCCINATE ER 25 MG PO TB24: 25.0000 mg | ORAL_TABLET | Freq: Every day | ORAL | 3 refills | Status: DC

## 2022-05-05 NOTE — Patient Instructions (Addendum)
Start Xarelto '20mg'$  once a day with supper  Start metoprolol '25mg'$  once a day   Cardioversion scheduled for Wednesday, July 5th  - Arrive at the [facility] and go to admitting at 930am  - Do not eat or drink anything after midnight the night prior to your procedure.  - Take all your morning medication (except diabetic medications) with a sip of water prior to arrival.  - You will not be able to drive home after your procedure.  - Do NOT miss any doses of your blood thinner - if you should miss a dose please notify our office immediately.  - If you feel as if you go back into normal rhythm prior to scheduled cardioversion, please notify our office immediately. If your procedure is canceled in the cardioversion suite you will be charged a cancellation fee.

## 2022-05-05 NOTE — Telephone Encounter (Signed)
Due to A Fib and the start of Xarelto Today per cardiology per note below  MD states no colon at this time,  ASSESSMENT AND PLAN: 1. Persistent atrial fibrillation  The patient's CHA2DS2-VASc score is 1, indicating a 0.6% annual risk of stroke.   Zio monitor showed 100% afib burden. We discussed rhythm control options today. We discussed EAST-AFNET trial and that rhythm control would be preferable to rate control given her young age despite her paucity of symptoms.  Will start Xarelto 20 mg daily and arrange for DCCV after 3 weeks of anticoagulation. She would not be able to hold anticoagulation for 4 weeks post DCCV, her colonoscopy will need to be rescheduled.  Will also start Toprol 25 mg daily for rate control.  She may be a good candidate for front-line ablation if her afib returns quickly.  Echocardiogram today.   Called pt- she will CB to Rs her Colon once she is Off Xarelto- Explained to her if after the ablation she remians on Xarelto x 1 yr, she will need to have an OV NOT PV to obtain Xarelto hold for a colonoscopy- she verbalized understanding and will CB once she can RS   Lenard Galloway  RN PV

## 2022-05-05 NOTE — Progress Notes (Addendum)
Primary Care Physician: Prince Solian, MD Primary Cardiologist: none Primary Electrophysiologist: none Referring Physician: Dr Leone Brand is a 56 y.o. female with a history of HLD, hypothyroidism, atrial fibrillation who presents for follow up in the [facility].  The patient was initially diagnosed with atrial fibrillation 04/15/22 after presenting to her PCP for a routine visit. She admits she has had palpitations off and on for about 3 years. She has several family members with afib, her mother is a patient of Dr Curt Bears. There does not seem to be any particular trigger for her palpitations. Patient has a CHADS2VASC score of 1. She does admit to snoring and witnessed apnea.   On follow up today, patient wore a cardiac monitor which showed 100% afib burden avg HR 97 bpm with rapid rates at times. She is unaware of her arrhythmia.   Today, she denies symptoms of palpitations, chest pain, shortness of breath, orthopnea, PND, lower extremity edema, dizziness, presyncope, syncope, snoring, daytime somnolence, bleeding, or neurologic sequela. The patient is tolerating medications without difficulties and is otherwise without complaint today.    Atrial Fibrillation Risk Factors:  she does have symptoms or diagnosis of sleep apnea. she is agreeable for sleep study.  she does not have a history of rheumatic fever. she does not have a history of alcohol use. The patient does have a history of early familial atrial fibrillation or other arrhythmias. Many family members have afib.  she has a BMI of Body mass index is 32.99 kg/m.Marland Kitchen Filed Weights   05/05/22 1349  Weight: 92.7 kg     Family History  Problem Relation Age of Onset   Heart disease Mother    Colon polyps Mother    Prostate cancer Father    Dementia Father    Breast cancer Maternal Aunt        in 76's   Diabetes Paternal Grandmother    Stroke Maternal Grandmother    Breast cancer  Maternal Aunt        in 39's   Colon cancer Neg Hx      Atrial Fibrillation Management history:  Previous antiarrhythmic drugs: none Previous cardioversions: none Previous ablations: none CHADS2VASC score: 1 Anticoagulation history: none   Past Medical History:  Diagnosis Date   High cholesterol    History of migraine    Hypothyroidism    Mitral valve prolapse    mild   Past Surgical History:  Procedure Laterality Date   BREAST BIOPSY Right 2013   BREAST BIOPSY Left 2002   eyelid surgery Right 2016   LAPAROSCOPIC ABDOMINAL EXPLORATION  1994   lysis of adhesions   TMJ ARTHROSCOPY  1986    Current Outpatient Medications  Medication Sig Dispense Refill   levothyroxine (SYNTHROID, LEVOTHROID) 75 MCG tablet Take 75 mcg by mouth daily before breakfast.     Multiple Vitamin (MULTI-VITAMIN DAILY PO) Take one tablet by mouth every day Oral     pantoprazole (PROTONIX) 40 MG tablet Take 40 mg by mouth daily.     rivaroxaban (XARELTO) 20 MG TABS tablet      rosuvastatin (CRESTOR) 10 MG tablet Take 10 mg by mouth daily.     No current facility-administered medications for this encounter.    No Known Allergies  Social History   Socioeconomic History   Marital status: Married    Spouse name: Not on file   Number of children: Not on file   Years of education: Not  on file   Highest education level: Not on file  Occupational History   Not on file  Tobacco Use   Smoking status: Never   Smokeless tobacco: Never   Tobacco comments:    Never smoke 04/21/22  Substance and Sexual Activity   Alcohol use: Yes    Alcohol/week: 2.0 - 4.0 standard drinks of alcohol    Types: 2 - 4 Standard drinks or equivalent per week    Comment: 1-2 mixed drinks 1-2 a month 04/21/22   Drug use: No   Sexual activity: Not on file  Other Topics Concern   Not on file  Social History Narrative   Not on file   Social Determinants of Health   Financial Resource Strain: Not on file  Food  Insecurity: Not on file  Transportation Needs: Not on file  Physical Activity: Not on file  Stress: Not on file  Social Connections: Not on file  Intimate Partner Violence: Not on file     ROS- All systems are reviewed and negative except as per the HPI above.  Physical Exam: Vitals:   05/05/22 1349  BP: (!) 138/100  Pulse: (!) 112  Weight: 92.7 kg  Height: '5\' 6"'$  (1.676 m)     GEN- The patient is a well appearing obese female, alert and oriented x 3 today.   HEENT-head normocephalic, atraumatic, sclera clear, conjunctiva pink, hearing intact, trachea midline. Lungs- Clear to ausculation bilaterally, normal work of breathing Heart- irregular rate and rhythm, no murmurs, rubs or gallops  GI- soft, NT, ND, + BS Extremities- no clubbing, cyanosis, or edema MS- no significant deformity or atrophy Skin- no rash or lesion Psych- euthymic mood, full affect Neuro- strength and sensation are intact   Wt Readings from Last 3 Encounters:  05/05/22 92.7 kg  04/21/22 92.1 kg  02/06/16 79.8 kg    EKG today demonstrates  Afib with RVR Vent. rate 112 BPM PR interval * ms QRS duration 84 ms QT/QTcB 348/475 ms  Epic records are reviewed at length today  CHA2DS2-VASc Score = 1  The patient's score is based upon: CHF History: 0 HTN History: 0 Diabetes History: 0 Stroke History: 0 Vascular Disease History: 0 Age Score: 0 Gender Score: 1       ASSESSMENT AND PLAN: 1. Persistent atrial fibrillation  The patient's CHA2DS2-VASc score is 1, indicating a 0.6% annual risk of stroke.   Zio monitor showed 100% afib burden. We discussed rhythm control options today. We discussed EAST-AFNET trial and that rhythm control would be preferable to rate control given her young age despite her paucity of symptoms.  Will start Xarelto 20 mg daily and arrange for DCCV after 3 weeks of anticoagulation. She would not be able to hold anticoagulation for 4 weeks post DCCV, her colonoscopy will  need to be rescheduled.  Will also start Toprol 25 mg daily for rate control.  She may be a good candidate for front-line ablation if her afib returns quickly.  Echocardiogram today.   2. Obesity Body mass index is 32.99 kg/m. Lifestyle modification was discussed and encouraged including regular physical activity and weight reduction.  3. Snoring/witnessed apnea Referred for sleep study.    Follow up in the AF clinic post DCCV.    [facility] [address] [place], [place] [address] [phone] 05/05/2022 2:01 PM

## 2022-05-06 ENCOUNTER — Telehealth: Payer: Self-pay | Admitting: *Deleted

## 2022-05-06 NOTE — Telephone Encounter (Signed)
Prior Authorization for SPLIT NIGHT sent to [facility] via web portal. Tracking Number [id number] -Tracking Number:[id number] Authorization Number:Status:Cancelled-No Auth Required.

## 2022-05-08 ENCOUNTER — Encounter (HOSPITAL_COMMUNITY): Payer: Self-pay | Admitting: *Deleted

## 2022-05-08 ENCOUNTER — Other Ambulatory Visit (HOSPITAL_COMMUNITY): Payer: Self-pay | Admitting: *Deleted

## 2022-05-15 ENCOUNTER — Telehealth (HOSPITAL_COMMUNITY): Payer: Self-pay | Admitting: *Deleted

## 2022-05-15 MED ORDER — APIXABAN 5 MG PO TABS: 5.0000 mg | ORAL_TABLET | Freq: Two times a day (BID) | ORAL | 3 refills | Status: DC

## 2022-05-19 ENCOUNTER — Encounter (HOSPITAL_COMMUNITY): Payer: Self-pay | Admitting: Cardiology

## 2022-05-19 ENCOUNTER — Ambulatory Visit (HOSPITAL_COMMUNITY)
Admission: RE | Admit: 2022-05-19 | Discharge: 2022-05-19 | Disposition: A | Discharge status: Home / Self Care | Payer: BC Managed Care – PPO | Source: Ambulatory Visit | Attending: Physician Assistant | Admitting: Physician Assistant

## 2022-05-19 DIAGNOSIS — I4819 Other persistent atrial fibrillation: Secondary | ICD-10-CM | POA: Insufficient documentation

## 2022-05-19 LAB — BASIC METABOLIC PANEL
Anion gap: 9 (ref 5–15)
BUN: 10 mg/dL (ref 6–20)
CO2: 22 mmol/L (ref 22–32)
Calcium: 9 mg/dL (ref 8.9–10.3)
Chloride: 106 mmol/L (ref 98–111)
Creatinine, Ser: 0.85 mg/dL (ref 0.44–1.00)
GFR, Estimated: >60 mL/min (ref >60)
Glucose, Bld: 132 mg/dL — ABNORMAL HIGH (ref 70–99)
Potassium: 3.7 mmol/L (ref 3.5–5.1)
Sodium: 137 mmol/L (ref 135–145)

## 2022-05-19 LAB — CBC
HCT: 46.7 % — ABNORMAL HIGH (ref 36.0–46.0)
Hemoglobin: 15.2 g/dL — ABNORMAL HIGH (ref 12.0–15.0)
MCH: 29.3 pg (ref 26.0–34.0)
MCHC: 32.5 g/dL (ref 30.0–36.0)
MCV: 90 fL (ref 80.0–100.0)
Platelets: 195 10*3/uL (ref 150–400)
RBC: 5.19 MIL/uL — ABNORMAL HIGH (ref 3.87–5.11)
RDW: 12 % (ref 11.5–15.5)
WBC: 4 10*3/uL (ref 4.0–10.5)
nRBC: 0 % (ref 0.0–0.2)

## 2022-05-19 NOTE — H&P (View-Only) (Signed)
Patient returns for pre DCCV lab work and ECG. ECG shows afib HR 120, QRS 84, QTc 480. Patient scheduled for DCCV on 05/28/22. F/u in the AF clinic as scheduled.   Patient decided to stay on Xarelto instead of switching to Eliquis. Her back pain has resolved.

## 2022-05-28 ENCOUNTER — Encounter (HOSPITAL_COMMUNITY)
Admission: RE | Disposition: A | Discharge status: Home / Self Care | Payer: Self-pay | Source: Home / Self Care | Attending: Cardiology

## 2022-05-28 ENCOUNTER — Ambulatory Visit (HOSPITAL_COMMUNITY): Payer: BC Managed Care – PPO | Admitting: Anesthesiology

## 2022-05-28 ENCOUNTER — Ambulatory Visit (HOSPITAL_COMMUNITY)
Admission: RE | Admit: 2022-05-28 | Discharge: 2022-05-28 | Disposition: A | Discharge status: Home / Self Care | Payer: BC Managed Care – PPO | Attending: Cardiology | Admitting: Cardiology

## 2022-05-28 ENCOUNTER — Encounter (HOSPITAL_COMMUNITY): Payer: Self-pay | Admitting: Cardiology

## 2022-05-28 ENCOUNTER — Other Ambulatory Visit: Payer: Self-pay

## 2022-05-28 DIAGNOSIS — I4891 Unspecified atrial fibrillation: Secondary | ICD-10-CM

## 2022-05-28 DIAGNOSIS — E039 Hypothyroidism, unspecified: Secondary | ICD-10-CM | POA: Diagnosis not present

## 2022-05-28 DIAGNOSIS — Z7901 Long term (current) use of anticoagulants: Secondary | ICD-10-CM | POA: Diagnosis not present

## 2022-05-28 HISTORY — PX: CARDIOVERSION: SHX1299

## 2022-05-28 HISTORY — DX: Family history of other specified conditions: Z84.89

## 2022-05-28 SURGERY — CARDIOVERSION
Anesthesia: General

## 2022-05-28 MED ORDER — LIDOCAINE 2% (20 MG/ML) 5 ML SYRINGE
INTRAMUSCULAR | Status: DC | PRN
  Administered 2022-05-28: 60 mg via INTRAVENOUS

## 2022-05-28 MED ORDER — PROPOFOL 10 MG/ML IV BOLUS
INTRAVENOUS | Status: DC | PRN
  Administered 2022-05-28 (×2): 50 mg via INTRAVENOUS

## 2022-05-28 MED ORDER — SODIUM CHLORIDE 0.9 % IV SOLN
INTRAVENOUS | Status: AC | PRN
  Administered 2022-05-28: 500 mL via INTRAVENOUS

## 2022-05-28 NOTE — Transfer of Care (Signed)
Immediate Anesthesia Transfer of Care Note  Patient: Cynthia Flowers  Procedure(s) Performed: CARDIOVERSION  Patient Location: PACU and Endoscopy Unit  Anesthesia Type:General  Level of Consciousness: drowsy  Airway & Oxygen Therapy: Patient Spontanous Breathing and non-rebreather face mask  Post-op Assessment: Report given to RN and Post -op Vital signs reviewed and stable  Post vital signs: Reviewed and stable  Last Vitals:  Vitals Value Taken Time  BP    Temp    Pulse    Resp    SpO2      Last Pain:  Vitals:   05/28/22 1040  TempSrc: Temporal  PainSc: 0-No pain         Complications: No notable events documented.

## 2022-05-28 NOTE — CV Procedure (Signed)
   Electrical Cardioversion Procedure Note Cynthia Flowers [id number] [birth year]  Procedure: Electrical Cardioversion Indications:  Atrial Fibrillation  Time Out: Verified patient identification, verified procedure,medications/allergies/relevent history reviewed, required imaging and test results available.  Performed  Procedure Details  The patient signed informed consent.   The patient was NPO past midnight. Has had therapeutic anticoagulation with Xeralto greater than 3 weeks. The patient denies any interruption of anticoagulation.  Anesthesia was administered by Dr. Roanna Banning.  Adequate airway was maintained throughout and vital followed per protocol.  He was cardioverted x 2 with 200 J of biphasic synchronized energy.  He converted to NSR.  There were no apparent complications.  The patient tolerated the procedure well and had normal neuro status and respiratory status post procedure with vitals stable as recorded elsewhere.     IMPRESSION:  Successful cardioversion of atrial fibrillation to sinus rhythm, HR 67 bpm.   Follow up:  We will arrange follow up with primary cardiologist.  He will continue on current medical therapy.  The patient advised to continue anticoagulation.  Cynthia Flowers 05/28/2022, 11:34 AM

## 2022-05-28 NOTE — Anesthesia Postprocedure Evaluation (Signed)
Anesthesia Post Note  Patient: Cynthia Flowers  Procedure(s) Performed: CARDIOVERSION     Patient location during evaluation: Endoscopy Anesthesia Type: General Level of consciousness: awake Pain management: pain level controlled Vital Signs Assessment: post-procedure vital signs reviewed and stable Respiratory status: spontaneous breathing, nonlabored ventilation, respiratory function stable and patient connected to nasal cannula oxygen Cardiovascular status: blood pressure returned to baseline and stable Postop Assessment: no apparent nausea or vomiting Anesthetic complications: no   No notable events documented.  Last Vitals:  Vitals:   05/28/22 1141 05/28/22 1150  BP: 117/89 128/81  Pulse: 62 64  Resp: 12 (!) 9  Temp:    SpO2: 96% 98%    Last Pain:  Vitals:   05/28/22 1150  TempSrc:   PainSc: 0-No pain                 [name] P Angline Schweigert

## 2022-05-28 NOTE — Discharge Instructions (Signed)

## 2022-05-28 NOTE — Interval H&P Note (Signed)
History and Physical Interval Note:  05/28/2022 10:59 AM  Cynthia Flowers  has presented today for surgery, with the diagnosis of atrial fibrillation.  The various methods of treatment have been discussed with the patient and family. After consideration of risks, benefits and other options for treatment, the patient has consented to  Procedure(s): CARDIOVERSION (N/A) as a surgical intervention.  The patient's history has been reviewed, patient examined, no change in status, stable for surgery.  I have reviewed the patient's chart and labs.  Questions were answered to the patient's satisfaction.     [name]

## 2022-05-28 NOTE — Anesthesia Preprocedure Evaluation (Addendum)
Anesthesia Evaluation  Patient identified by MRN, date of birth, ID band Patient awake    Reviewed: Allergy & Precautions, NPO status , Patient's Chart, lab work & pertinent test results  Airway Mallampati: II       Dental no notable dental hx.    Pulmonary    Pulmonary exam normal        Cardiovascular + dysrhythmias Atrial Fibrillation  Rhythm:Regular Rate:Tachycardia     Neuro/Psych  Headaches,    GI/Hepatic negative GI ROS, (+)     substance abuse  ,   Endo/Other  Hypothyroidism   Renal/GU negative Renal ROS     Musculoskeletal negative musculoskeletal ROS (+)   Abdominal   Peds  Hematology  (+) Blood dyscrasia, ,   Anesthesia Other Findings atrial fibrillation  Reproductive/Obstetrics                            Anesthesia Physical Anesthesia Plan  ASA: 4  Anesthesia Plan: General   Post-op Pain Management:    Induction: Intravenous  PONV Risk Score and Plan: 3 and Propofol infusion and Treatment may vary due to age or medical condition  Airway Management Planned: Mask  Additional Equipment:   Intra-op Plan:   Post-operative Plan:   Informed Consent: I have reviewed the patients History and Physical, chart, labs and discussed the procedure including the risks, benefits and alternatives for the proposed anesthesia with the patient or authorized representative who has indicated his/her understanding and acceptance.     Dental advisory given  Plan Discussed with: CRNA  Anesthesia Plan Comments:        Anesthesia Quick Evaluation

## 2022-05-29 ENCOUNTER — Encounter (HOSPITAL_COMMUNITY): Payer: Self-pay | Admitting: Cardiology

## 2022-06-01 NOTE — H&P (Signed)
Interval H&P was performed on 05/28/2022.

## 2022-06-04 ENCOUNTER — Encounter (HOSPITAL_COMMUNITY): Payer: Self-pay | Admitting: Physician Assistant

## 2022-06-04 ENCOUNTER — Ambulatory Visit (HOSPITAL_COMMUNITY)
Admission: RE | Admit: 2022-06-04 | Discharge: 2022-06-04 | Disposition: A | Discharge status: Home / Self Care | Payer: BC Managed Care – PPO | Source: Ambulatory Visit | Attending: Physician Assistant | Admitting: Physician Assistant

## 2022-06-04 VITALS — BP 130/90 | HR 104 | Ht 66.0 in | Wt 198.4 lb

## 2022-06-04 DIAGNOSIS — R0683 Snoring: Secondary | ICD-10-CM | POA: Insufficient documentation

## 2022-06-04 DIAGNOSIS — Z7901 Long term (current) use of anticoagulants: Secondary | ICD-10-CM | POA: Diagnosis not present

## 2022-06-04 DIAGNOSIS — I4819 Other persistent atrial fibrillation: Secondary | ICD-10-CM | POA: Insufficient documentation

## 2022-06-04 DIAGNOSIS — E039 Hypothyroidism, unspecified: Secondary | ICD-10-CM | POA: Diagnosis not present

## 2022-06-04 DIAGNOSIS — Z6832 Body mass index (BMI) 32.0-32.9, adult: Secondary | ICD-10-CM | POA: Diagnosis not present

## 2022-06-04 DIAGNOSIS — E669 Obesity, unspecified: Secondary | ICD-10-CM | POA: Diagnosis not present

## 2022-06-04 NOTE — Progress Notes (Signed)
Primary Care Physician: Prince Solian, MD Primary Cardiologist: none Primary Electrophysiologist: none Referring Physician: Dr Leone Brand is a 56 y.o. female with a history of HLD, hypothyroidism, atrial fibrillation who presents for follow up in the [facility].  The patient was initially diagnosed with atrial fibrillation 04/15/22 after presenting to her PCP for a routine visit. She admits she has had palpitations off and on for about 3 years. She has several family members with afib, her mother is a patient of Dr Curt Bears. There does not seem to be any particular trigger for her palpitations. Patient has a CHADS2VASC score of 1. She does admit to snoring and witnessed apnea. Patient wore a cardiac monitor which showed 100% afib burden avg HR 97 bpm with rapid rates at times.   On follow up today, patient is s/p DCCV on 05/28/22. Patient reports that she was in [place] until 06/01/22 when she felt "fluttering" in her chest. The palpitations resolved but she remains in afib today. There were no specific triggers that she could identify.   Today, she denies symptoms of chest pain, shortness of breath, orthopnea, PND, lower extremity edema, dizziness, presyncope, syncope, bleeding, or neurologic sequela. The patient is tolerating medications without difficulties and is otherwise without complaint today.    Atrial Fibrillation Risk Factors:  she does have symptoms or diagnosis of sleep apnea. she is agreeable for sleep study.  she does not have a history of rheumatic fever. she does not have a history of alcohol use. The patient does have a history of early familial atrial fibrillation or other arrhythmias. Many family members have afib.  she has a BMI of Body mass index is 32.02 kg/m.Marland Kitchen Filed Weights   06/04/22 1520  Weight: 90 kg    Family History  Problem Relation Age of Onset   Heart disease Mother    Colon polyps Mother    Prostate cancer Father     Dementia Father    Breast cancer Maternal Aunt        in 70's   Diabetes Paternal Grandmother    Stroke Maternal Grandmother    Breast cancer Maternal Aunt        in 70's   Colon cancer Neg Hx      Atrial Fibrillation Management history:  Previous antiarrhythmic drugs: none Previous cardioversions: 05/28/22 Previous ablations: none CHADS2VASC score: 1 Anticoagulation history: Xarelto    Past Medical History:  Diagnosis Date   Family history of adverse reaction to anesthesia    High cholesterol    History of migraine    Hypothyroidism    Mitral valve prolapse    mild   Past Surgical History:  Procedure Laterality Date   BREAST BIOPSY Right 2013   BREAST BIOPSY Left 2002   CARDIOVERSION N/A 05/28/2022   Procedure: CARDIOVERSION;  Surgeon: Berniece Salines, DO;  Location: [facility];  Service: Cardiovascular;  Laterality: N/A;   eyelid surgery Right 2016   LAPAROSCOPIC ABDOMINAL EXPLORATION  1994   lysis of adhesions   TMJ ARTHROSCOPY  1986    Current Outpatient Medications  Medication Sig Dispense Refill   levothyroxine (SYNTHROID, LEVOTHROID) 75 MCG tablet Take 75 mcg by mouth daily before breakfast.     metoprolol succinate (TOPROL XL) 25 MG 24 hr tablet Take 1 tablet (25 mg total) by mouth daily. 30 tablet 3   pantoprazole (PROTONIX) 40 MG tablet Take 40 mg by mouth daily as needed (gastritis).  rivaroxaban (XARELTO) 20 MG TABS tablet Take 20 mg by mouth daily with supper.     rosuvastatin (CRESTOR) 10 MG tablet Take 10 mg by mouth daily.     traMADol (ULTRAM) 50 MG tablet Take 50 mg by mouth every 8 (eight) hours as needed (headache).     No current facility-administered medications for this encounter.    No Known Allergies  Social History   Socioeconomic History   Marital status: Married    Spouse name: Not on file   Number of children: Not on file   Years of education: Not on file   Highest education level: Not on file  Occupational History   Not on  file  Tobacco Use   Smoking status: Never   Smokeless tobacco: Never   Tobacco comments:    Never smoke 04/21/22  Substance and Sexual Activity   Alcohol use: Yes    Alcohol/week: 2.0 - 4.0 standard drinks of alcohol    Types: 2 - 4 Standard drinks or equivalent per week    Comment: 1-2 mixed drinks 1-2 a month 04/21/22   Drug use: No   Sexual activity: Not on file  Other Topics Concern   Not on file  Social History Narrative   Not on file   Social Determinants of Health   Financial Resource Strain: Not on file  Food Insecurity: Not on file  Transportation Needs: Not on file  Physical Activity: Not on file  Stress: Not on file  Social Connections: Not on file  Intimate Partner Violence: Not on file     ROS- All systems are reviewed and negative except as per the HPI above.  Physical Exam: Vitals:   06/04/22 1520  BP: 130/90  Pulse: (!) 104  Weight: 90 kg  Height: '5\' 6"'$  (1.676 m)     GEN- The patient is a well appearing obese female, alert and oriented x 3 today.   HEENT-head normocephalic, atraumatic, sclera clear, conjunctiva pink, hearing intact, trachea midline. Lungs- Clear to ausculation bilaterally, normal work of breathing Heart- irregular rate and rhythm, no murmurs, rubs or gallops  GI- soft, NT, ND, + BS Extremities- no clubbing, cyanosis, or edema MS- no significant deformity or atrophy Skin- no rash or lesion Psych- euthymic mood, full affect Neuro- strength and sensation are intact   Wt Readings from Last 3 Encounters:  06/04/22 90 kg  05/28/22 90.7 kg  05/05/22 92.7 kg    EKG today demonstrates  Afib Vent. rate 104 BPM PR interval * ms QRS duration 80 ms QT/QTcB 354/465 ms  Echo 05/05/22  1. Left ventricular ejection fraction, by estimation, is 60 to 65%. The  left ventricle has normal function. The left ventricle has no regional  wall motion abnormalities. Diastolic function indeterminant due to Afib.   2. Right ventricular systolic  function is normal. The right ventricular  size is mildly enlarged. There is normal pulmonary artery systolic  pressure. The estimated right ventricular systolic pressure is 65.7 mmHg.   3. The mitral valve is normal in structure. Trivial mitral valve  regurgitation.   4. The aortic valve is tricuspid. Aortic valve regurgitation is not  visualized. No aortic stenosis is present.   5. Aortic dilatation noted. There is borderline dilatation of the  ascending aorta, measuring 38 mm.   6. The inferior vena cava is dilated in size with >50% respiratory  variability, suggesting right atrial pressure of 8 mmHg.   Comparison(s): No prior Echocardiogram.   Epic records are reviewed  at length today  CHA2DS2-VASc Score = 1  The patient's score is based upon: CHF History: 0 HTN History: 0 Diabetes History: 0 Stroke History: 0 Vascular Disease History: 0 Age Score: 0 Gender Score: 1       ASSESSMENT AND PLAN: 1. Persistent atrial fibrillation  The patient's CHA2DS2-VASc score is 1, indicating a 0.6% annual risk of stroke.   S/p DCCV 05/28/22 with early return of afib. We discussed rhythm control options today including AAD and ablation. Patient agreeable to consultation with EP to discuss ablation.  Continue Xarelto 20 mg daily. Continue Toprol 25 mg daily for rate control.   2. Obesity Body mass index is 32.02 kg/m. Lifestyle modification was discussed and encouraged including regular physical activity and weight reduction.  3. Snoring/witnessed apnea Scheduled for sleep study 06/08/22   Follow up with EP to discuss possible ablation.    [facility] [address] [place], [place] [address] [phone] 06/04/2022 3:27 PM

## 2022-06-08 ENCOUNTER — Ambulatory Visit (HOSPITAL_BASED_OUTPATIENT_CLINIC_OR_DEPARTMENT_OTHER): Payer: BC Managed Care – PPO | Attending: Physician Assistant | Admitting: Cardiovascular Disease

## 2022-06-08 ENCOUNTER — Encounter (HOSPITAL_BASED_OUTPATIENT_CLINIC_OR_DEPARTMENT_OTHER): Payer: Self-pay

## 2022-06-08 DIAGNOSIS — I480 Paroxysmal atrial fibrillation: Secondary | ICD-10-CM

## 2022-06-08 NOTE — Progress Notes (Incomplete)
Patient arrived at [facility] she filled out her paper work and was taken to her room. Tech explained to patient that the type of test she was having was a split night study and what that in detailed. Patient then began to explain that she is very claustrophobic and cannot tolerate anything on her face. Tech allowed patient to try on several masks during cpap fitting trial without any success. Patient continued to pull masks off even though tech explained that there is a possibility that she may not even need the cpap device at all, that it all depended on if she had sleep apnea. Thus at this time pt was so anxious, and uneasy about the fact of having to possible put a cpap mask on as well as having to be hooked up with electrodes on her face and head that she decide to

## 2022-06-08 NOTE — Progress Notes (Unsigned)
06/08/2022  Dr. Shelva Majestic,  Patient arrived at [facility] she filled out her paper work and was taken to her room. Tech explained to patient that the type of test she was having was a split night study and what that in detailed. Patient then began to explain that she is very claustrophobic and cannot tolerate anything on her face. Tech allowed patient to try on several masks during cpap fitting trial without any success. Patient continued to pull masks off even though tech explained that there is a possibility that she may not even need the cpap device at all. That it all depended on if she had sleep apnea, even then she would have the right to refuse the mask and continue with baseline NPSG. Thus at this time patient was so anxious, and uneasy about the fact of having to possible wear a cpap mask on as well as having to be hooked up with electrodes on her head and face that she decide that she wanted to wait and discuss her other options with her doctor before doing any testing because of her claustrophobia.  Thanks Jocelyn Lamer Thelbert Gartin RRT, RPSGT, RST

## 2022-06-09 ENCOUNTER — Telehealth (HOSPITAL_COMMUNITY): Payer: Self-pay | Admitting: *Deleted

## 2022-06-09 DIAGNOSIS — R0681 Apnea, not elsewhere classified: Secondary | ICD-10-CM

## 2022-06-09 NOTE — Telephone Encounter (Signed)
Pt called in stating she went for her sleep study last night but is severely claustrophobic and knows she will not be able to tolerate any type of mask on her face therefore she did not proceed with sleep study. She would prefer sleep study at home and if diagnosed another method of treatment other than masking. Per Adline Peals PA will order home sleep study.

## 2022-06-12 DIAGNOSIS — H5213 Myopia, bilateral: Secondary | ICD-10-CM | POA: Diagnosis not present

## 2022-06-12 DIAGNOSIS — H40053 Ocular hypertension, bilateral: Secondary | ICD-10-CM | POA: Diagnosis not present

## 2022-06-16 ENCOUNTER — Encounter: Payer: Self-pay | Admitting: *Deleted

## 2022-06-16 ENCOUNTER — Encounter: Payer: BC Managed Care – PPO | Admitting: Internal Medicine

## 2022-06-16 ENCOUNTER — Encounter: Payer: Self-pay | Admitting: Cardiology

## 2022-06-16 ENCOUNTER — Ambulatory Visit (INDEPENDENT_AMBULATORY_CARE_PROVIDER_SITE_OTHER): Payer: BC Managed Care – PPO | Admitting: Cardiology

## 2022-06-16 VITALS — BP 134/94 | HR 62 | Ht 65.0 in | Wt 199.0 lb

## 2022-06-16 DIAGNOSIS — I4819 Other persistent atrial fibrillation: Secondary | ICD-10-CM | POA: Diagnosis not present

## 2022-06-16 DIAGNOSIS — Z01812 Encounter for preprocedural laboratory examination: Secondary | ICD-10-CM | POA: Diagnosis not present

## 2022-06-16 NOTE — Progress Notes (Signed)
Electrophysiology Office Note   Date:  06/16/2022   ID:  Cynthia Flowers, DOB 09-24-1966, MRN [id number]  PCP:  Prince Solian, MD  Cardiologist:   Primary Electrophysiologist:  Aggie Douse Meredith Leeds, MD    Chief Complaint: AF   History of Present Illness: Cynthia Flowers is a 56 y.o. female who is being seen today for the evaluation of AF at the request of Avva, Ravisankar, MD. Presenting today for electrophysiology evaluation.  Significant for hyperlipidemia, hypothyroidism, atrial fibrillation.  She was diagnosed with atrial fibrillation 04/15/2022 after presenting to her primary physician.  She has had palpitations for the 3 years prior to that.  She does not note any trigger for her palpitations.  She does admit to snoring and witnessed apnea.  She had a cardioversion 05/28/2022.  She remained in sinus rhythm for approximately 4 days, but noted fluttering after that.  She does not state that she feels overly fatigued, but did feel improved after her cardioversion.  She had an attempt at a sleep study, though was claustrophobic and was unable to tolerate it.  Today, she denies symptoms of palpitations, chest pain, shortness of breath, orthopnea, PND, lower extremity edema, claudication, dizziness, presyncope, syncope, bleeding, or neurologic sequela. The patient is tolerating medications without difficulties.    Past Medical History:  Diagnosis Date   Family history of adverse reaction to anesthesia    High cholesterol    History of migraine    Hypothyroidism    Mitral valve prolapse    mild   Past Surgical History:  Procedure Laterality Date   BREAST BIOPSY Right 2013   BREAST BIOPSY Left 2002   CARDIOVERSION N/A 05/28/2022   Procedure: CARDIOVERSION;  Surgeon: Berniece Salines, DO;  Location: [facility];  Service: Cardiovascular;  Laterality: N/A;   eyelid surgery Right 2016   LAPAROSCOPIC ABDOMINAL EXPLORATION  1994   lysis of adhesions   TMJ ARTHROSCOPY  1986     Current  Outpatient Medications  Medication Sig Dispense Refill   levothyroxine (SYNTHROID, LEVOTHROID) 75 MCG tablet Take 75 mcg by mouth daily before breakfast.     metoprolol succinate (TOPROL XL) 25 MG 24 hr tablet Take 1 tablet (25 mg total) by mouth daily. 30 tablet 3   pantoprazole (PROTONIX) 40 MG tablet Take 40 mg by mouth daily as needed (gastritis).     rivaroxaban (XARELTO) 20 MG TABS tablet Take 20 mg by mouth daily with supper.     rosuvastatin (CRESTOR) 10 MG tablet Take 10 mg by mouth daily.     traMADol (ULTRAM) 50 MG tablet Take 50 mg by mouth every 8 (eight) hours as needed (headache).     No current facility-administered medications for this visit.    Allergies:   Patient has no known allergies.   Social History:  The patient  reports that she has never smoked. She has never used smokeless tobacco. She reports current alcohol use of about 2.0 - 4.0 standard drinks of alcohol per week. She reports that she does not use drugs.   Family History:  The patient's family history includes Breast cancer in her maternal aunt and maternal aunt; Colon polyps in her mother; Dementia in her father; Diabetes in her paternal grandmother; Heart disease in her mother; Prostate cancer in her father; Stroke in her maternal grandmother.    ROS:  Please see the history of present illness.   Otherwise, review of systems is positive for none.   All other systems are reviewed and negative.  PHYSICAL EXAM: VS:  BP (!) 134/94   Pulse 62   Ht '5\' 5"'$  (1.651 m)   Wt 199 lb (90.3 kg)   LMP 04/04/2011   SpO2 98%   BMI 33.12 kg/m  , BMI Body mass index is 33.12 kg/m. GEN: Well nourished, well developed, in no acute distress  HEENT: normal  Neck: no JVD, carotid bruits, or masses Cardiac: irregular; no murmurs, rubs, or gallops,no edema  Respiratory:  clear to auscultation bilaterally, normal work of breathing GI: soft, nontender, nondistended, + BS MS: no deformity or atrophy  Skin: warm and  dry Neuro:  Strength and sensation are intact Psych: euthymic mood, full affect  EKG:  EKG is not ordered today. Personal review of the ekg ordered 06/04/22 shows AF  Recent Labs: 04/02/2022: ALT 30 05/19/2022: BUN 10; Creatinine, Ser 0.85; Hemoglobin 15.2; Platelets 195; Potassium 3.7; Sodium 137    Lipid Panel  No results found for: "CHOL", "TRIG", "HDL", "CHOLHDL", "VLDL", "LDLCALC", "LDLDIRECT"   Wt Readings from Last 3 Encounters:  06/16/22 199 lb (90.3 kg)  06/08/22 200 lb (90.7 kg)  06/04/22 198 lb 6.4 oz (90 kg)      Other studies Reviewed: Additional studies/ records that were reviewed today include: TTE 05/05/22  Review of the above records today demonstrates:   1. Left ventricular ejection fraction, by estimation, is 60 to 65%. The  left ventricle has normal function. The left ventricle has no regional  wall motion abnormalities. Diastolic function indeterminant due to Afib.   2. Right ventricular systolic function is normal. The right ventricular  size is mildly enlarged. There is normal pulmonary artery systolic  pressure. The estimated right ventricular systolic pressure is 17.4 mmHg.   3. The mitral valve is normal in structure. Trivial mitral valve  regurgitation.   4. The aortic valve is tricuspid. Aortic valve regurgitation is not  visualized. No aortic stenosis is present.   5. Aortic dilatation noted. There is borderline dilatation of the  ascending aorta, measuring 38 mm.   6. The inferior vena cava is dilated in size with >50% respiratory  variability, suggesting right atrial pressure of 8 mmHg.    ASSESSMENT AND PLAN:  1.  Persistent atrial fibrillation: CHA2DS2-VASc of 1.  Currently on Xarelto 20 mg daily, metoprolol 25 mg twice daily.  She unfortunately went back into atrial fibrillation despite her cardioversion.  She would benefit from rhythm control.  She would prefer to avoid medications.  Due to that, we Marsheila Alejo plan for ablation.  Risk, benefits,  and alternatives to EP study and radiofrequency ablation for afib were also discussed in detail today. These risks include but are not limited to stroke, bleeding, vascular damage, tamponade, perforation, damage to the esophagus, lungs, and other structures, pulmonary vein stenosis, worsening renal function, and death. The patient understands these risk and wishes to proceed.  We Fahim Kats therefore proceed with catheter ablation at the next available time.  Carto, ICE, anesthesia are requested for the procedure.  Tiarna Koppen also obtain CT PV protocol prior to the procedure to exclude LAA thrombus and further evaluate atrial anatomy.   2.  Obesity: Lifestyle modification encouraged Body mass index is 33.12 kg/m.  3.  Snoring: Plan for outpatient sleep study  Current medicines are reviewed at length with the patient today.   The patient does not have concerns regarding her medicines.  The following changes were made today:  none  Labs/ tests ordered today include:  Orders Placed This Encounter  Procedures  CT CARDIAC MORPH/PULM VEIN W/CM&W/O CA SCORE   Basic metabolic panel   CBC     Disposition:   FU with Edgel Degnan 3 months  Signed, Irvin Lizama Meredith Leeds, MD  06/16/2022 11:47 AM     [facility] [address] [place] [address] [phone] (office) [phone] (fax)

## 2022-06-16 NOTE — Patient Instructions (Signed)
Medication Instructions:  Your physician recommends that you continue on your current medications as directed. Please refer to the Current Medication list given to you today.  *If you need a refill on your cardiac medications before your next appointment, please call your pharmacy*   Lab Work: Pre procedure labs - see procedure instruction letter:  BMP & CBC  If you have labs (blood work) drawn today and your tests are completely normal, you will receive your results only by: MyChart Message (if you have MyChart) OR A paper copy in the mail If you have any lab test that is abnormal or we need to change your treatment, we will call you to review the results.   Testing/Procedures: Your physician has requested that you have cardiac CT within 7 days PRIOR to your ablation. Cardiac computed tomography (CT) is a painless test that uses an x-ray machine to take clear, detailed pictures of your heart.  Please follow instruction below located under "other instructions". You will get a call from our office to schedule the date for this test.  Your physician has recommended that you have an ablation. Catheter ablation is a medical procedure used to treat some cardiac arrhythmias (irregular heartbeats). During catheter ablation, a long, thin, flexible tube is put into a blood vessel in your groin (upper thigh), or neck. This tube is called an ablation catheter. It is then guided to your heart through the blood vessel. Radio frequency waves destroy small areas of heart tissue where abnormal heartbeats may cause an arrhythmia to start. Please follow instruction letter given to you today.   Follow-Up: At [facility], you and your health needs are our priority.  As part of our continuing mission to provide you with exceptional heart care, we have created designated Provider Care Teams.  These Care Teams include your primary Cardiologist (physician) and Advanced Practice Providers (APPs -  Physician  Assistants and Nurse Practitioners) who all work together to provide you with the care you need, when you need it.  Your next appointment:   1 month(s) after your ablation  The format for your next appointment:   In Person  Provider:   AFib clinic   Thank you for choosing [facility]!!   Charitie Hinote, RN [phone]    Other Instructions  Cardiac Ablation Cardiac ablation is a procedure to destroy (ablate) some heart tissue that is sending bad signals. These bad signals cause problems in heart rhythm. The heart has many areas that make these signals. If there are problems in these areas, they can make the heart beat in a way that is not normal. Destroying some tissues can help make the heart rhythm normal. Tell your doctor about: Any allergies you have. All medicines you are taking. These include vitamins, herbs, eye drops, creams, and over-the-counter medicines. Any problems you or family members have had with medicines that make you fall asleep (anesthetics). Any blood disorders you have. Any surgeries you have had. Any medical conditions you have, such as kidney failure. Whether you are pregnant or may be pregnant. What are the risks? This is a safe procedure. But problems may occur, including: Infection. Bruising and bleeding. Bleeding into the chest. Stroke or blood clots. Damage to nearby areas of your body. Allergies to medicines or dyes. The need for a pacemaker if the normal system is damaged. Failure of the procedure to treat the problem. What happens before the procedure? Medicines Ask your doctor about: Changing or stopping your normal medicines. This is   important. Taking aspirin and ibuprofen. Do not take these medicines unless your doctor tells you to take them. Taking other medicines, vitamins, herbs, and supplements. General instructions Follow instructions from your doctor about what you cannot eat or drink. Plan to have someone take you home  from the hospital or clinic. If you will be going home right after the procedure, plan to have someone with you for 24 hours. Ask your doctor what steps will be taken to prevent infection. What happens during the procedure?  An IV tube will be put into one of your veins. You will be given a medicine to help you relax. The skin on your neck or groin will be numbed. A cut (incision) will be made in your neck or groin. A needle will be put through your cut and into a large vein. A tube (catheter) will be put into the needle. The tube will be moved to your heart. Dye may be put through the tube. This helps your doctor see your heart. Small devices (electrodes) on the tube will send out signals. A type of energy will be used to destroy some heart tissue. The tube will be taken out. Pressure will be held on your cut. This helps stop bleeding. A bandage will be put over your cut. The exact procedure may vary among doctors and hospitals. What happens after the procedure? You will be watched until you leave the hospital or clinic. This includes checking your heart rate, breathing rate, oxygen, and blood pressure. Your cut will be watched for bleeding. You will need to lie still for a few hours. Do not drive for 24 hours or as long as your doctor tells you. Summary Cardiac ablation is a procedure to destroy some heart tissue. This is done to treat heart rhythm problems. Tell your doctor about any medical conditions you may have. Tell him or her about all medicines you are taking to treat them. This is a safe procedure. But problems may occur. These include infection, bruising, bleeding, and damage to nearby areas of your body. Follow what your doctor tells you about food and drink. You may also be told to change or stop some of your medicines. After the procedure, do not drive for 24 hours or as long as your doctor tells you. This information is not intended to replace advice given to you by your  health care provider. Make sure you discuss any questions you have with your health care provider. Document Revised: 10/12/2019 Document Reviewed: 10/12/2019 Elsevier Patient Education  2023 Elsevier Inc.  

## 2022-07-21 ENCOUNTER — Other Ambulatory Visit: Payer: Self-pay | Admitting: Obstetrics and Gynecology

## 2022-07-21 ENCOUNTER — Other Ambulatory Visit: Payer: Self-pay | Admitting: Internal Medicine

## 2022-07-21 DIAGNOSIS — Z1231 Encounter for screening mammogram for malignant neoplasm of breast: Secondary | ICD-10-CM

## 2022-07-22 ENCOUNTER — Ambulatory Visit (HOSPITAL_BASED_OUTPATIENT_CLINIC_OR_DEPARTMENT_OTHER): Payer: BC Managed Care – PPO | Attending: Physician Assistant | Admitting: Cardiovascular Disease

## 2022-07-22 VITALS — Ht 65.0 in | Wt 197.0 lb

## 2022-07-22 DIAGNOSIS — G4733 Obstructive sleep apnea (adult) (pediatric): Secondary | ICD-10-CM | POA: Diagnosis not present

## 2022-07-22 DIAGNOSIS — I480 Paroxysmal atrial fibrillation: Secondary | ICD-10-CM

## 2022-07-22 DIAGNOSIS — R0681 Apnea, not elsewhere classified: Secondary | ICD-10-CM | POA: Diagnosis not present

## 2022-07-24 ENCOUNTER — Other Ambulatory Visit (HOSPITAL_COMMUNITY): Payer: Self-pay | Admitting: Physician Assistant

## 2022-07-30 DIAGNOSIS — E039 Hypothyroidism, unspecified: Secondary | ICD-10-CM | POA: Diagnosis not present

## 2022-08-10 ENCOUNTER — Other Ambulatory Visit: Payer: Self-pay | Admitting: Cardiovascular Disease

## 2022-08-10 ENCOUNTER — Encounter (HOSPITAL_BASED_OUTPATIENT_CLINIC_OR_DEPARTMENT_OTHER): Payer: Self-pay | Admitting: Cardiovascular Disease

## 2022-08-10 ENCOUNTER — Telehealth: Payer: Self-pay | Admitting: *Deleted

## 2022-08-10 DIAGNOSIS — I480 Paroxysmal atrial fibrillation: Secondary | ICD-10-CM

## 2022-08-10 DIAGNOSIS — G4733 Obstructive sleep apnea (adult) (pediatric): Secondary | ICD-10-CM

## 2022-08-10 NOTE — Procedures (Signed)
       Patient Name: Cynthia Flowers, Campos Date: 07/23/2022 Gender: Female D.O.B: [birth year] Age (years): 73 Referring Provider: Malka So PA Height (inches): 65 Interpreting Physician: Shelva Majestic MD, ABSM Weight (lbs): 197 RPSGT: Gerhard Perches BMI: 33 MRN: [id number] Neck Size: 13.40  CLINICAL INFORMATION Sleep Study Type: HST  Indication for sleep study: Atrial fibrillation, obesity, witnessed apnea  Epworth Sleepiness Score: 1  SLEEP STUDY TECHNIQUE A multi-channel overnight portable sleep study was performed. The channels recorded were: nasal airflow, thoracic respiratory movement, and oxygen saturation with a pulse oximetry. Snoring was also monitored.  MEDICATIONS levothyroxine (SYNTHROID, LEVOTHROID) 75 MCG tablet metoprolol succinate (TOPROL-XL) 25 MG 24 hr tablet pantoprazole (PROTONIX) 40 MG tablet rivaroxaban (XARELTO) 20 MG TABS tablet rosuvastatin (CRESTOR) 10 MG tablet traMADol (ULTRAM) 50 MG tablet  Patient self administered medications include: N/A.  SLEEP ARCHITECTURE Patient was studied for 371 minutes. The sleep efficiency was 100.0 % and the patient was supine for 0%. The arousal index was 0.0 per hour.  RESPIRATORY PARAMETERS The overall AHI was 38.5 per hour, with a central apnea index of 0 per hour.  The oxygen nadir was 90% during sleep.  CARDIAC DATA Mean heart rate during sleep was 75.8 bpm with a range of 60 to 106 bpm.  IMPRESSIONS - Severe obstructive sleep apnea occurred during this study (AHI 38.5/h). - The patient had no oxygen desaturation during the study (Min O2 90%) - Patient snored for 33.1 minutes (8.9%) during the sleep.  DIAGNOSIS - Obstructive Sleep Apnea (G47.33)  RECOMMENDATIONS - In this patient with cardiovascular comorbidities and severe obstructive sleep apnea recommend therapeutic CPAP for optimal treatment of her sleep disordered breathing. Recommend an in-lab titration, but if unable initiate Auto-PAP  with EPR of 3 at 6 - 18 cm of water.  - Effort should be made to optimize nasal and oropharyngeal patency. - Avoid alcohol, sedatives and other CNS depressants that may worsen sleep apnea and disrupt normal sleep architecture. - Sleep hygiene should be reviewed to assess factors that may improve sleep quality. - Weight management (BMI 33) and regular exercise should be initiated or continued. - Recommend a downlaod and sleep clinic evalution after 4 weeks of therapy.  [Electronically signed] 08/10/2022 02:16 PM  Shelva Majestic MD, [facility], [place], American Board of Sleep Medicine  NPI: [id number]  [place] PH: [phone]   FX: [phone] [place]

## 2022-08-10 NOTE — Telephone Encounter (Signed)
Patient notified of HST results and recommendations. She agrees to proceed with CPAP titration pending [employer] approval.

## 2022-08-10 NOTE — Telephone Encounter (Signed)
-----   Message from Cynthia Sine, MD sent at 08/10/2022  2:22 PM EDT ----- Mariann Laster, please notify pt of results, and try for in-lab CPAP titration, if unable, then Auto-PAP

## 2022-08-12 ENCOUNTER — Telehealth: Payer: Self-pay | Admitting: *Deleted

## 2022-08-12 NOTE — Telephone Encounter (Signed)
Called [employer] to get PA for CPAP titration. Per representative policy only covers HST, equipment and supplies. Call reference # [id number]. Patient notified. APAP orders will be sent to [facility] per Dr Evette Georges orders.

## 2022-08-17 ENCOUNTER — Ambulatory Visit
Admission: RE | Admit: 2022-08-17 | Discharge: 2022-08-17 | Disposition: A | Discharge status: Home / Self Care | Payer: BC Managed Care – PPO | Source: Ambulatory Visit | Attending: Obstetrics and Gynecology | Admitting: Obstetrics and Gynecology

## 2022-08-17 DIAGNOSIS — Z1231 Encounter for screening mammogram for malignant neoplasm of breast: Secondary | ICD-10-CM | POA: Diagnosis not present

## 2022-08-24 DIAGNOSIS — G4733 Obstructive sleep apnea (adult) (pediatric): Secondary | ICD-10-CM | POA: Diagnosis not present

## 2022-09-23 ENCOUNTER — Telehealth: Payer: Self-pay | Admitting: *Deleted

## 2022-09-23 ENCOUNTER — Ambulatory Visit: Payer: BC Managed Care – PPO | Attending: Cardiology

## 2022-09-23 DIAGNOSIS — Z01812 Encounter for preprocedural laboratory examination: Secondary | ICD-10-CM | POA: Diagnosis not present

## 2022-09-23 DIAGNOSIS — I4819 Other persistent atrial fibrillation: Secondary | ICD-10-CM

## 2022-09-23 NOTE — Telephone Encounter (Signed)
Patient called in to say that she just cannot use her CPAP machine, and she plans to return the machine to [facility]. We discussed the risks of not treating her OSA. She states that she is aware of the risks. I also informed her that if she returns the machine and later after speaking with her doctors she decides to retry using it she will have to start the entire process over with another sleep study. Patient was told to contact Bernie at [facility] to see if he has any mask suggestions for her. If changing masks does not help we can then order for her to do mask desensitization. She agrees not to turn it in right now and will contact Skip Estimable, RT for assistance.

## 2022-09-24 DIAGNOSIS — G4733 Obstructive sleep apnea (adult) (pediatric): Secondary | ICD-10-CM | POA: Diagnosis not present

## 2022-09-24 LAB — BASIC METABOLIC PANEL
BUN/Creatinine Ratio: 19 (ref 9–23)
BUN: 14 mg/dL (ref 6–24)
CO2: 25 mmol/L (ref 20–29)
Calcium: 9.8 mg/dL (ref 8.7–10.2)
Chloride: 99 mmol/L (ref 96–106)
Creatinine, Ser: 0.74 mg/dL (ref 0.57–1.00)
Glucose: 112 mg/dL — ABNORMAL HIGH (ref 70–99)
Potassium: 4.4 mmol/L (ref 3.5–5.2)
Sodium: 136 mmol/L (ref 134–144)
eGFR: 94 mL/min/{1.73_m2} (ref >59)

## 2022-09-24 LAB — CBC
Hematocrit: 42.3 % (ref 34.0–46.6)
Hemoglobin: 14.7 g/dL (ref 11.1–15.9)
MCH: 30.8 pg (ref 26.6–33.0)
MCHC: 34.8 g/dL (ref 31.5–35.7)
MCV: 89 fL (ref 79–97)
Platelets: 271 10*3/uL (ref 150–450)
RBC: 4.77 x10E6/uL (ref 3.77–5.28)
RDW: 12.6 % (ref 11.7–15.4)
WBC: 7.1 10*3/uL (ref 3.4–10.8)

## 2022-09-28 ENCOUNTER — Telehealth (HOSPITAL_COMMUNITY): Payer: Self-pay | Admitting: *Deleted

## 2022-09-28 NOTE — Telephone Encounter (Signed)
Reaching out to patient to offer assistance regarding upcoming cardiac imaging study; pt verbalizes understanding of appt date/time, parking situation and where to check in, and verified current allergies; name and call back number provided for further questions should they arise  Rhilyn Battle RN Navigator Cardiac Imaging [facility] [phone] office [phone] cell  

## 2022-09-29 ENCOUNTER — Ambulatory Visit (HOSPITAL_BASED_OUTPATIENT_CLINIC_OR_DEPARTMENT_OTHER)
Admission: RE | Admit: 2022-09-29 | Discharge: 2022-09-29 | Disposition: A | Discharge status: Home / Self Care | Payer: BC Managed Care – PPO | Source: Ambulatory Visit | Attending: Cardiology | Admitting: Cardiology

## 2022-09-29 DIAGNOSIS — I4819 Other persistent atrial fibrillation: Secondary | ICD-10-CM | POA: Insufficient documentation

## 2022-09-29 MED ORDER — IOHEXOL 350 MG/ML SOLN
80.0000 mL | Freq: Once | INTRAVENOUS | Status: AC | PRN
  Administered 2022-09-29: 80 mL via INTRAVENOUS

## 2022-10-05 NOTE — Pre-Procedure Instructions (Signed)
Instructed patient on the following items: Arrival time 0800 Nothing to eat or drink after midnight No meds AM of procedure Responsible person to drive you home and stay with you for 24 hrs  Have you missed any doses of anti-coagulant Xarelto- hasn't missed any doses    

## 2022-10-06 ENCOUNTER — Other Ambulatory Visit: Payer: Self-pay

## 2022-10-06 ENCOUNTER — Encounter (HOSPITAL_COMMUNITY)
Admission: RE | Disposition: A | Discharge status: Home / Self Care | Payer: Self-pay | Source: Ambulatory Visit | Attending: Cardiology

## 2022-10-06 ENCOUNTER — Ambulatory Visit (HOSPITAL_COMMUNITY): Payer: BC Managed Care – PPO | Admitting: Certified Registered Nurse Anesthetist

## 2022-10-06 ENCOUNTER — Ambulatory Visit (HOSPITAL_COMMUNITY)
Admission: RE | Admit: 2022-10-06 | Discharge: 2022-10-06 | Disposition: A | Discharge status: Home / Self Care | Payer: BC Managed Care – PPO | Source: Ambulatory Visit | Attending: Cardiology | Admitting: Cardiology

## 2022-10-06 DIAGNOSIS — I4819 Other persistent atrial fibrillation: Secondary | ICD-10-CM | POA: Diagnosis not present

## 2022-10-06 DIAGNOSIS — I4891 Unspecified atrial fibrillation: Secondary | ICD-10-CM | POA: Diagnosis not present

## 2022-10-06 DIAGNOSIS — R0681 Apnea, not elsewhere classified: Secondary | ICD-10-CM | POA: Diagnosis not present

## 2022-10-06 DIAGNOSIS — E039 Hypothyroidism, unspecified: Secondary | ICD-10-CM | POA: Insufficient documentation

## 2022-10-06 DIAGNOSIS — E785 Hyperlipidemia, unspecified: Secondary | ICD-10-CM | POA: Insufficient documentation

## 2022-10-06 DIAGNOSIS — R0683 Snoring: Secondary | ICD-10-CM | POA: Diagnosis not present

## 2022-10-06 HISTORY — PX: ATRIAL FIBRILLATION ABLATION: EP1191

## 2022-10-06 LAB — POCT I-STAT CREATININE: Creatinine, Ser: 0.8 mg/dL (ref 0.44–1.00)

## 2022-10-06 LAB — POCT ACTIVATED CLOTTING TIME
Activated Clotting Time: 281 seconds
Activated Clotting Time: 287 seconds

## 2022-10-06 SURGERY — ATRIAL FIBRILLATION ABLATION
Anesthesia: General

## 2022-10-06 MED ORDER — DEXAMETHASONE SODIUM PHOSPHATE 10 MG/ML IJ SOLN
INTRAMUSCULAR | Status: DC | PRN
  Administered 2022-10-06: 10 mg via INTRAVENOUS

## 2022-10-06 MED ORDER — HEPARIN (PORCINE) IN NACL 1000-0.9 UT/500ML-% IV SOLN
INTRAVENOUS | Status: DC | PRN
  Administered 2022-10-06 (×4): 500 mL

## 2022-10-06 MED ORDER — FENTANYL CITRATE (PF) 250 MCG/5ML IJ SOLN
INTRAMUSCULAR | Status: DC | PRN
  Administered 2022-10-06: 100 ug via INTRAVENOUS

## 2022-10-06 MED ORDER — ONDANSETRON HCL 4 MG/2ML IJ SOLN: 4.0000 mg | Freq: Four times a day (QID) | INTRAMUSCULAR | Status: DC | PRN

## 2022-10-06 MED ORDER — LIDOCAINE 2% (20 MG/ML) 5 ML SYRINGE
INTRAMUSCULAR | Status: DC | PRN
  Administered 2022-10-06: 60 mg via INTRAVENOUS

## 2022-10-06 MED ORDER — SODIUM CHLORIDE 0.9 % IV SOLN: 250.0000 mL | INTRAVENOUS | Status: DC | PRN

## 2022-10-06 MED ORDER — DOBUTAMINE INFUSION FOR EP/ECHO/NUC (1000 MCG/ML)
INTRAVENOUS | Status: DC | PRN
  Administered 2022-10-06: 20 ug/kg/min via INTRAVENOUS

## 2022-10-06 MED ORDER — PHENYLEPHRINE HCL-NACL 20-0.9 MG/250ML-% IV SOLN
INTRAVENOUS | Status: DC | PRN
  Administered 2022-10-06: 15 ug/min via INTRAVENOUS

## 2022-10-06 MED ORDER — HEPARIN (PORCINE) IN NACL 1000-0.9 UT/500ML-% IV SOLN
INTRAVENOUS | Status: AC
  Filled 2022-10-06: qty 500

## 2022-10-06 MED ORDER — MIDAZOLAM HCL 2 MG/2ML IJ SOLN
INTRAMUSCULAR | Status: DC | PRN
  Administered 2022-10-06: 2 mg via INTRAVENOUS

## 2022-10-06 MED ORDER — HEPARIN SODIUM (PORCINE) 1000 UNIT/ML IJ SOLN
INTRAMUSCULAR | Status: AC
  Filled 2022-10-06: qty 10

## 2022-10-06 MED ORDER — SODIUM CHLORIDE 0.9% FLUSH: 3.0000 mL | Freq: Two times a day (BID) | INTRAVENOUS | Status: DC

## 2022-10-06 MED ORDER — PROPOFOL 10 MG/ML IV BOLUS
INTRAVENOUS | Status: DC | PRN
  Administered 2022-10-06: 140 mg via INTRAVENOUS

## 2022-10-06 MED ORDER — ACETAMINOPHEN 500 MG PO TABS
ORAL_TABLET | ORAL | Status: AC
  Filled 2022-10-06: qty 2

## 2022-10-06 MED ORDER — ROCURONIUM BROMIDE 10 MG/ML (PF) SYRINGE
PREFILLED_SYRINGE | INTRAVENOUS | Status: DC | PRN
  Administered 2022-10-06: 50 mg via INTRAVENOUS

## 2022-10-06 MED ORDER — ACETAMINOPHEN 325 MG PO TABS: 650.0000 mg | ORAL_TABLET | ORAL | Status: DC | PRN

## 2022-10-06 MED ORDER — SODIUM CHLORIDE 0.9 % IV SOLN: INTRAVENOUS | Status: DC

## 2022-10-06 MED ORDER — ONDANSETRON HCL 4 MG/2ML IJ SOLN
INTRAMUSCULAR | Status: DC | PRN
  Administered 2022-10-06: 4 mg via INTRAVENOUS

## 2022-10-06 MED ORDER — HEPARIN SODIUM (PORCINE) 1000 UNIT/ML IJ SOLN
INTRAMUSCULAR | Status: DC | PRN
  Administered 2022-10-06: 4000 [IU] via INTRAVENOUS
  Administered 2022-10-06: 14000 [IU] via INTRAVENOUS

## 2022-10-06 MED ORDER — DOBUTAMINE INFUSION FOR EP/ECHO/NUC (1000 MCG/ML)
INTRAVENOUS | Status: AC
  Filled 2022-10-06: qty 250

## 2022-10-06 MED ORDER — SUGAMMADEX SODIUM 200 MG/2ML IV SOLN
INTRAVENOUS | Status: DC | PRN
  Administered 2022-10-06: 160 mg via INTRAVENOUS

## 2022-10-06 MED ORDER — HEPARIN SODIUM (PORCINE) 1000 UNIT/ML IJ SOLN
INTRAMUSCULAR | Status: DC | PRN
  Administered 2022-10-06: 1000 [IU] via INTRAVENOUS

## 2022-10-06 MED ORDER — SODIUM CHLORIDE 0.9% FLUSH: 3.0000 mL | INTRAVENOUS | Status: DC | PRN

## 2022-10-06 MED ORDER — ACETAMINOPHEN 500 MG PO TABS
1000.0000 mg | ORAL_TABLET | Freq: Once | ORAL | Status: AC
  Administered 2022-10-06: 1000 mg via ORAL

## 2022-10-06 MED ORDER — PROTAMINE SULFATE 10 MG/ML IV SOLN
INTRAVENOUS | Status: DC | PRN
  Administered 2022-10-06: 40 mg via INTRAVENOUS

## 2022-10-06 SURGICAL SUPPLY — 19 items
CATH 8FR REPROCESSED SOUNDSTAR (CATHETERS) ×1 IMPLANT
CATH 8FR SOUNDSTAR REPROCESSED (CATHETERS) IMPLANT
CATH ABLAT QDOT MICRO BI TC FJ (CATHETERS) IMPLANT
CATH OCTARAY 2.0 F 3-3-3-3-3 (CATHETERS) IMPLANT
CATH PIGTAIL STEERABLE D1 8.7 (WIRE) IMPLANT
CATH S-M CIRCA TEMP PROBE (CATHETERS) IMPLANT
CATH WEB BI DIR CSDF CRV REPRO (CATHETERS) IMPLANT
CLOSURE PERCLOSE PROSTYLE (VASCULAR PRODUCTS) IMPLANT
COVER SWIFTLINK CONNECTOR (BAG) ×2 IMPLANT
PACK EP LATEX FREE (CUSTOM PROCEDURE TRAY) ×1
PACK EP LF (CUSTOM PROCEDURE TRAY) ×2 IMPLANT
PAD DEFIB RADIO PHYSIO CONN (PAD) ×2 IMPLANT
PATCH CARTO3 (PAD) IMPLANT
SHEATH CARTO VIZIGO SM CVD (SHEATH) IMPLANT
SHEATH PINNACLE 7F 10CM (SHEATH) IMPLANT
SHEATH PINNACLE 8F 10CM (SHEATH) IMPLANT
SHEATH PINNACLE 9F 10CM (SHEATH) IMPLANT
SHEATH PROBE COVER 6X72 (BAG) IMPLANT
TUBING SMART ABLATE COOLFLOW (TUBING) IMPLANT

## 2022-10-06 NOTE — Anesthesia Preprocedure Evaluation (Addendum)
Anesthesia Evaluation  Patient identified by MRN, date of birth, ID band Patient awake    Reviewed: Allergy & Precautions, H&P , NPO status , Patient's Chart, lab work & pertinent test results  Airway Mallampati: II  TM Distance: >3 FB Neck ROM: Full    Dental no notable dental hx. (+) Teeth Intact, Dental Advisory Given   Pulmonary neg pulmonary ROS   Pulmonary exam normal breath sounds clear to auscultation       Cardiovascular + dysrhythmias Atrial Fibrillation + Valvular Problems/Murmurs MVP  Rhythm:Regular Rate:Normal     Neuro/Psych negative neurological ROS  negative psych ROS   GI/Hepatic negative GI ROS, Neg liver ROS,,,  Endo/Other  Hypothyroidism    Renal/GU negative Renal ROS  negative genitourinary   Musculoskeletal   Abdominal   Peds  Hematology negative hematology ROS (+)   Anesthesia Other Findings   Reproductive/Obstetrics negative OB ROS                             Anesthesia Physical Anesthesia Plan  ASA: 3  Anesthesia Plan: General   Post-op Pain Management: Tylenol PO (pre-op)*   Induction: Intravenous  PONV Risk Score and Plan: 4 or greater and Ondansetron, Dexamethasone and Midazolam  Airway Management Planned: Oral ETT  Additional Equipment:   Intra-op Plan:   Post-operative Plan: Extubation in OR  Informed Consent: I have reviewed the patients History and Physical, chart, labs and discussed the procedure including the risks, benefits and alternatives for the proposed anesthesia with the patient or authorized representative who has indicated his/her understanding and acceptance.     Dental advisory given  Plan Discussed with: CRNA  Anesthesia Plan Comments:        Anesthesia Quick Evaluation

## 2022-10-06 NOTE — Transfer of Care (Signed)
Immediate Anesthesia Transfer of Care Note  Patient: Cynthia Flowers  Procedure(s) Performed: ATRIAL FIBRILLATION ABLATION  Patient Location: Cath Lab  Anesthesia Type:General  Level of Consciousness: drowsy and patient cooperative  Airway & Oxygen Therapy: Patient Spontanous Breathing and Patient connected to nasal cannula oxygen  Post-op Assessment: Report given to RN, Post -op Vital signs reviewed and stable, and Patient moving all extremities X 4  Post vital signs: Reviewed and stable  Last Vitals:  Vitals Value Taken Time  BP 114/69 10/06/22 1153  Temp    Pulse 71 10/06/22 1154  Resp 12 10/06/22 1154  SpO2 95 % 10/06/22 1154  Vitals shown include unvalidated device data.  Last Pain:  Vitals:   10/06/22 0815  TempSrc: Oral  PainSc:          Complications: There were no known notable events for this encounter.

## 2022-10-06 NOTE — Anesthesia Procedure Notes (Signed)
Procedure Name: Intubation Date/Time: 10/06/2022 10:19 AM  Performed by: Darletta Moll, CRNAPre-anesthesia Checklist: Patient identified, Emergency Drugs available, Suction available and Patient being monitored Patient Re-evaluated:Patient Re-evaluated prior to induction Oxygen Delivery Method: Circle system utilized Preoxygenation: Pre-oxygenation with 100% oxygen Induction Type: IV induction Ventilation: Mask ventilation without difficulty and Two handed mask ventilation required Laryngoscope Size: Mac and 3 Grade View: Grade II Tube type: Oral Tube size: 7.0 mm Number of attempts: 1 Airway Equipment and Method: Stylet and Oral airway Placement Confirmation: ETT inserted through vocal cords under direct vision, positive ETCO2 and breath sounds checked- equal and bilateral Secured at: 21 cm Tube secured with: Tape Dental Injury: Teeth and Oropharynx as per pre-operative assessment

## 2022-10-06 NOTE — Progress Notes (Signed)
Post ambulaton done, pt tolerated well. No s/s of bleeding or hematoma noted. Will continue to monitor.

## 2022-10-06 NOTE — H&P (Signed)
Electrophysiology Office Note   Date:  10/06/2022   ID:  Cynthia Flowers, DOB July 09, 1966, MRN [id number]  PCP:  Prince Solian, MD  Cardiologist:   Primary Electrophysiologist:  Kimani Hovis Meredith Leeds, MD    Chief Complaint: AF   History of Present Illness: Cynthia Flowers is a 56 y.o. female who is being seen today for the evaluation of AF at the request of No ref. provider found. Presenting today for electrophysiology evaluation.  Significant for hyperlipidemia, hypothyroidism, atrial fibrillation.  She was diagnosed with atrial fibrillation 04/15/2022 after presenting to her primary physician.  She has had palpitations for the 3 years prior to that.  She does not note any trigger for her palpitations.  She does admit to snoring and witnessed apnea.  She had a cardioversion 05/28/2022.  She remained in sinus rhythm for approximately 4 days, but noted fluttering after that.  She does not state that she feels overly fatigued, but did feel improved after her cardioversion.  She had an attempt at a sleep study, though was claustrophobic and was unable to tolerate it.  Today, denies symptoms of palpitations, chest pain, shortness of breath, orthopnea, PND, lower extremity edema, claudication, dizziness, presyncope, syncope, bleeding, or neurologic sequela. The patient is tolerating medications without difficulties. Plan ablation today.    Past Medical History:  Diagnosis Date   Family history of adverse reaction to anesthesia    High cholesterol    History of migraine    Hypothyroidism    Mitral valve prolapse    mild   Past Surgical History:  Procedure Laterality Date   BREAST BIOPSY Right 2013   BREAST BIOPSY Left 2002   CARDIOVERSION N/A 05/28/2022   Procedure: CARDIOVERSION;  Surgeon: Berniece Salines, DO;  Location: [facility];  Service: Cardiovascular;  Laterality: N/A;   eyelid surgery Right 2016   LAPAROSCOPIC ABDOMINAL EXPLORATION  1994   lysis of adhesions   TMJ ARTHROSCOPY  1986      Current Facility-Administered Medications  Medication Dose Route Frequency Provider Last Rate Last Admin   0.9 %  sodium chloride infusion   Intravenous Continuous Constance Haw, MD 50 mL/hr at 10/06/22 0818 New Bag at 10/06/22 0818   acetaminophen (TYLENOL) 500 MG tablet             Allergies:   Patient has no known allergies.   Social History:  The patient  reports that she has never smoked. She has never used smokeless tobacco. She reports current alcohol use of about 2.0 - 4.0 standard drinks of alcohol per week. She reports that she does not use drugs.   Family History:  The patient's family history includes Breast cancer in her maternal aunt and maternal aunt; Colon polyps in her mother; Dementia in her father; Diabetes in her paternal grandmother; Heart disease in her mother; Prostate cancer in her father; Stroke in her maternal grandmother.   ROS:  Please see the history of present illness.   Otherwise, review of systems is positive for none.   All other systems are reviewed and negative.   PHYSICAL EXAM: VS:  BP (!) 144/98   Pulse 94   Temp 98.1 F (36.7 C) (Oral)   Resp 16   Ht '5\' 6"'$  (1.676 m)   Wt 89.4 kg   LMP 04/04/2011   SpO2 96%   BMI 31.80 kg/m  , BMI Body mass index is 31.8 kg/m. GEN: Well nourished, well developed, in no acute distress  HEENT: normal  Neck:  no JVD, carotid bruits, or masses Cardiac: irregular; no murmurs, rubs, or gallops,no edema  Respiratory:  clear to auscultation bilaterally, normal work of breathing GI: soft, nontender, nondistended, + BS MS: no deformity or atrophy  Skin: warm and dry Neuro:  Strength and sensation are intact Psych: euthymic mood, full affect   Recent Labs: 04/02/2022: ALT 30 09/23/2022: BUN 14; Creatinine, Ser 0.74; Hemoglobin 14.7; Platelets 271; Potassium 4.4; Sodium 136    Lipid Panel  No results found for: "CHOL", "TRIG", "HDL", "CHOLHDL", "VLDL", "LDLCALC", "LDLDIRECT"   Wt Readings from Last  3 Encounters:  10/06/22 89.4 kg  07/22/22 89.4 kg  06/16/22 90.3 kg      Other studies Reviewed: Additional studies/ records that were reviewed today include: TTE 05/05/22  Review of the above records today demonstrates:   1. Left ventricular ejection fraction, by estimation, is 60 to 65%. The  left ventricle has normal function. The left ventricle has no regional  wall motion abnormalities. Diastolic function indeterminant due to Afib.   2. Right ventricular systolic function is normal. The right ventricular  size is mildly enlarged. There is normal pulmonary artery systolic  pressure. The estimated right ventricular systolic pressure is 11.5 mmHg.   3. The mitral valve is normal in structure. Trivial mitral valve  regurgitation.   4. The aortic valve is tricuspid. Aortic valve regurgitation is not  visualized. No aortic stenosis is present.   5. Aortic dilatation noted. There is borderline dilatation of the  ascending aorta, measuring 38 mm.   6. The inferior vena cava is dilated in size with >50% respiratory  variability, suggesting right atrial pressure of 8 mmHg.    ASSESSMENT AND PLAN:  1.  Persistent atrial fibrillation: Cynthia Flowers has presented today for surgery, with the diagnosis of AF.  The various methods of treatment have been discussed with the patient and family. After consideration of risks, benefits and other options for treatment, the patient has consented to  Procedure(s): Catheter ablation as a surgical intervention .  Risks include but not limited to complete heart block, stroke, esophageal damage, nerve damage, bleeding, vascular damage, tamponade, perforation, MI, and death. The patient's history has been reviewed, patient examined, no change in status, stable for surgery.  I have reviewed the patient's chart and labs.  Questions were answered to the patient's satisfaction.    Taitum Menton Curt Bears, MD 10/06/2022 9:21 AM

## 2022-10-06 NOTE — Anesthesia Postprocedure Evaluation (Signed)
Anesthesia Post Note  Patient: Cynthia Flowers  Procedure(s) Performed: ATRIAL FIBRILLATION ABLATION     Patient location during evaluation: Phase II Anesthesia Type: General Level of consciousness: awake and alert Pain management: pain level controlled Vital Signs Assessment: post-procedure vital signs reviewed and stable Respiratory status: spontaneous breathing, nonlabored ventilation and respiratory function stable Cardiovascular status: blood pressure returned to baseline and stable Postop Assessment: no apparent nausea or vomiting Anesthetic complications: no  There were no known notable events for this encounter.  Last Vitals:  Vitals:   10/06/22 1223 10/06/22 1225  BP:  106/62  Pulse:  67  Resp:  15  Temp: 36.7 C   SpO2:  93%    Last Pain:  Vitals:   10/06/22 1242  TempSrc:   PainSc: 0-No pain                 Othar Curto,W. EDMOND

## 2022-10-07 ENCOUNTER — Telehealth: Payer: Self-pay | Admitting: Cardiology

## 2022-10-07 ENCOUNTER — Encounter (HOSPITAL_COMMUNITY): Payer: Self-pay | Admitting: Cardiology

## 2022-10-07 NOTE — Telephone Encounter (Signed)
Called patient about her message. Patient stated her dressing in her grion is bloody. Patient denies an gushing at site and has been holding pressure here and there. Consult Dr. Curt Bears, he advised patient to change dressing and hold pressure for 20 minutes, and if it continues to see A FIB clinic. Called patient back with advisement. Patient verbalized understanding.

## 2022-10-07 NOTE — Telephone Encounter (Signed)
Patient called stating she had an ablation done yesterday.  She states that the wound at her groin she believes she is still bleeding from it, she states she the bandage is covered in blood and the tape is also.  She wants to know if she should change the bandage and what she should look for.  She said she is on blood thinners.

## 2022-10-16 ENCOUNTER — Emergency Department (HOSPITAL_COMMUNITY)
Admission: EM | Admit: 2022-10-16 | Discharge: 2022-10-16 | Disposition: A | Discharge status: Home / Self Care | Payer: BC Managed Care – PPO | Attending: Emergency Medicine | Admitting: Emergency Medicine

## 2022-10-16 ENCOUNTER — Emergency Department (HOSPITAL_COMMUNITY): Payer: BC Managed Care – PPO

## 2022-10-16 ENCOUNTER — Other Ambulatory Visit: Payer: Self-pay

## 2022-10-16 DIAGNOSIS — H538 Other visual disturbances: Secondary | ICD-10-CM | POA: Diagnosis not present

## 2022-10-16 DIAGNOSIS — I4891 Unspecified atrial fibrillation: Secondary | ICD-10-CM | POA: Diagnosis not present

## 2022-10-16 DIAGNOSIS — I639 Cerebral infarction, unspecified: Secondary | ICD-10-CM | POA: Diagnosis not present

## 2022-10-16 DIAGNOSIS — Z7901 Long term (current) use of anticoagulants: Secondary | ICD-10-CM | POA: Diagnosis not present

## 2022-10-16 DIAGNOSIS — H539 Unspecified visual disturbance: Secondary | ICD-10-CM | POA: Insufficient documentation

## 2022-10-16 DIAGNOSIS — R9431 Abnormal electrocardiogram [ECG] [EKG]: Secondary | ICD-10-CM | POA: Diagnosis not present

## 2022-10-16 LAB — CBC
HCT: 43.4 % (ref 36.0–46.0)
Hemoglobin: 14.4 g/dL (ref 12.0–15.0)
MCH: 30.2 pg (ref 26.0–34.0)
MCHC: 33.2 g/dL (ref 30.0–36.0)
MCV: 91 fL (ref 80.0–100.0)
Platelets: 268 10*3/uL (ref 150–400)
RBC: 4.77 MIL/uL (ref 3.87–5.11)
RDW: 12.2 % (ref 11.5–15.5)
WBC: 6.4 10*3/uL (ref 4.0–10.5)
nRBC: 0 % (ref 0.0–0.2)

## 2022-10-16 LAB — DIFFERENTIAL
Abs Immature Granulocytes: 0.02 10*3/uL (ref 0.00–0.07)
Basophils Absolute: 0.1 10*3/uL (ref 0.0–0.1)
Basophils Relative: 1 %
Eosinophils Absolute: 0.1 10*3/uL (ref 0.0–0.5)
Eosinophils Relative: 2 %
Immature Granulocytes: 0 %
Lymphocytes Relative: 33 %
Lymphs Abs: 2.1 10*3/uL (ref 0.7–4.0)
Monocytes Absolute: 0.7 10*3/uL (ref 0.1–1.0)
Monocytes Relative: 10 %
Neutro Abs: 3.5 10*3/uL (ref 1.7–7.7)
Neutrophils Relative %: 54 %

## 2022-10-16 LAB — COMPREHENSIVE METABOLIC PANEL WITH GFR
ALT: 30 U/L (ref 0–44)
AST: 30 U/L (ref 15–41)
Albumin: 4.5 g/dL (ref 3.5–5.0)
Alkaline Phosphatase: 88 U/L (ref 38–126)
Anion gap: 14 (ref 5–15)
BUN: 17 mg/dL (ref 6–20)
CO2: 21 mmol/L — ABNORMAL LOW (ref 22–32)
Calcium: 9.8 mg/dL (ref 8.9–10.3)
Chloride: 105 mmol/L (ref 98–111)
Creatinine, Ser: 0.87 mg/dL (ref 0.44–1.00)
GFR, Estimated: >60 mL/min
Glucose, Bld: 106 mg/dL — ABNORMAL HIGH (ref 70–99)
Potassium: 4 mmol/L (ref 3.5–5.1)
Sodium: 140 mmol/L (ref 135–145)
Total Bilirubin: 0.5 mg/dL (ref 0.3–1.2)
Total Protein: 7.4 g/dL (ref 6.5–8.1)

## 2022-10-16 LAB — PROTIME-INR
INR: 1.2 (ref 0.8–1.2)
Prothrombin Time: 15.3 s — ABNORMAL HIGH (ref 11.4–15.2)

## 2022-10-16 LAB — APTT: aPTT: 31 seconds (ref 24–36)

## 2022-10-16 NOTE — ED Notes (Signed)
Pt A&OX4 ambulatory at d/c with independent steady gait. Pt verbalized understanding of d/c instructions and follow up care. 

## 2022-10-16 NOTE — Discharge Instructions (Addendum)
You were seen in in the emergency department for an episode of visual disturbance.  You had an MRI of your brain that did not show any evidence of stroke.  Please continue your regular medications and follow-up with your primary care doctor.  Return to the emergency department if any worsening or concerning symptoms.

## 2022-10-16 NOTE — ED Provider Triage Note (Signed)
Emergency Medicine Provider Triage Evaluation Note  JERSI MCMASTER , a 56 y.o. female  was evaluated in triage.  Pt complains of approximately 30 minutes of vision change on Wednesday night (today is Friday).  Patient had a recent cardiac ablation.  She is on Xarelto.  She did not have subsequent headache or neck pain.  She describes a color change in her right upper visual field, described as a kaleidoscope, that resolved.  It has not returned.  Patient denies signs of stroke including: facial droop, slurred speech, aphasia, weakness/numbness in extremities, imbalance/trouble walking.   Review of Systems  Positive: Vision change Negative: Difficulty with speech, balance.  Denies weakness.  Physical Exam  BP (!) 159/101 (BP Location: Left Arm)   Pulse 90   Temp 97.7 F (36.5 C)   Resp 19   LMP 04/04/2011   SpO2 100%  Gen:   Awake, no distress   Resp:  Normal effort  MSK:   Moves extremities without difficulty  Other:  Pupils PERRL, EOMI  Medical Decision Making  Medically screening exam initiated at 3:50 PM.  Appropriate orders placed.  TIYE HUWE was informed that the remainder of the evaluation will be completed by another provider, this initial triage assessment does not replace that evaluation, and the importance of remaining in the ED until their evaluation is complete.     Carlisle Cater, PA-C 10/16/22 1553

## 2022-10-16 NOTE — ED Notes (Signed)
Pt ambulatory to restroom with independent steady gait °

## 2022-10-16 NOTE — ED Provider Notes (Signed)
[facility] EMERGENCY DEPARTMENT Provider Note   CSN: [id number] Arrival date & time: 10/16/22  1536     History  Chief Complaint  Patient presents with   Visual Field Change    Cynthia Flowers is a 56 y.o. female.  She has a history of atrial fibrillation and underwent an ablation last week.  She has been on Eliquis since March.  2 days ago she experienced about 30 minutes of a kaleidoscope in her peripheral vision.  It was not associated with any other symptoms and resolved on its own.  It did not involve her central vision but she was unsure if it was 1 eye or both eyes.  No headache.  Due to her recent ablation she was told she was at higher risk for stroke.  She has been on Eliquis and taking it.   The history is provided by the patient.  Eye Problem Onset quality:  Sudden Duration:  30 minutes Timing:  Constant Progression:  Resolved Chronicity:  New Relieved by:  None tried Worsened by:  Nothing Ineffective treatments:  None tried Associated symptoms: no double vision, no facial rash, no headaches, no nausea, no numbness, no photophobia, no tingling, no vomiting and no weakness        Home Medications Prior to Admission medications   Medication Sig Start Date End Date Taking? Authorizing Provider  acetaminophen (TYLENOL) 500 MG tablet Take 500-1,000 mg by mouth every 6 (six) hours as needed (pain.).    [provider]  levothyroxine (SYNTHROID, LEVOTHROID) 75 MCG tablet Take 75 mcg by mouth daily before breakfast.    [provider]  metoprolol succinate (TOPROL-XL) 25 MG 24 hr tablet Take 1 tablet (25 mg total) by mouth daily. Patient taking differently: Take 25 mg by mouth every evening. 07/28/22   Fenton, Clint R, PA  pantoprazole (PROTONIX) 40 MG tablet Take 40 mg by mouth daily as needed (gastritis). 02/27/22   [provider]  rivaroxaban (XARELTO) 20 MG TABS tablet Take 20 mg by mouth daily with supper.    [provider]  rosuvastatin (CRESTOR) 10 MG tablet Take 10 mg by mouth in the morning. 02/09/22   [provider]  traMADol (ULTRAM) 50 MG tablet Take 50 mg by mouth every 8 (eight) hours as needed (headache). 05/05/22   [provider]      Allergies    Patient has no known allergies.    Review of Systems   Review of Systems  Constitutional:  Negative for fever.  HENT:  Negative for sore throat.   Eyes:  Positive for visual disturbance. Negative for double vision and photophobia.  Respiratory:  Negative for shortness of breath.   Cardiovascular:  Negative for chest pain.  Gastrointestinal:  Negative for abdominal pain, nausea and vomiting.  Genitourinary:  Negative for dysuria.  Musculoskeletal:  Negative for neck pain.  Skin:  Negative for rash.  Neurological:  Negative for tingling, speech difficulty, weakness, numbness and headaches.    Physical Exam Updated Vital Signs BP (!) 159/101 (BP Location: Left Arm)   Pulse 90   Temp 97.7 F (36.5 C)   Resp 19   LMP 04/04/2011   SpO2 100%  Physical Exam Vitals and nursing note reviewed.  Constitutional:      General: She is not in acute distress.    Appearance: Normal appearance. She is well-developed.  HENT:     Head: Normocephalic and atraumatic.  Eyes:     Conjunctiva/sclera: Conjunctivae  normal.  Cardiovascular:     Rate and Rhythm: Normal rate and regular rhythm.     Heart sounds: No murmur heard. Pulmonary:     Effort: Pulmonary effort is normal. No respiratory distress.     Breath sounds: Normal breath sounds.  Abdominal:     Palpations: Abdomen is soft.     Tenderness: There is no abdominal tenderness.  Musculoskeletal:        General: No swelling.     Cervical back: Neck supple.  Skin:    General: Skin is warm and dry.     Capillary Refill: Capillary refill takes less than 2 seconds.  Neurological:     General: No focal deficit present.     Mental Status: She is alert and oriented to  person, place, and time.     Cranial Nerves: No cranial nerve deficit.     Sensory: No sensory deficit.     Motor: No weakness.     Gait: Gait normal.     ED Results / Procedures / Treatments   Labs (all labs ordered are listed, but only abnormal results are displayed) Labs Reviewed  PROTIME-INR - Abnormal; Notable for the following components:      Result Value   Prothrombin Time 15.3 (*)    All other components within normal limits  COMPREHENSIVE METABOLIC PANEL - Abnormal; Notable for the following components:   CO2 21 (*)    Glucose, Bld 106 (*)    All other components within normal limits  APTT  CBC  DIFFERENTIAL    EKG EKG Interpretation  Date/Time:  Friday October 16 2022 15:42:25 EST Ventricular Rate:  82 PR Interval:  144 QRS Duration: 80 QT Interval:  398 QTC Calculation: 464 R Axis:   32 Text Interpretation: Normal sinus rhythm Nonspecific ST abnormality Abnormal ECG When compared with ECG of 06-Oct-2022 12:00, No significant change since last tracing Confirmed by Aletta Edouard [phone]) on 10/16/2022 5:43:42 PM  Radiology MR BRAIN WO CONTRAST  Result Date: 10/16/2022 CLINICAL DATA:  Stroke suspected, vision changes EXAM: MRI HEAD WITHOUT CONTRAST TECHNIQUE: Multiplanar, multiecho pulse sequences of the brain and surrounding structures were obtained without intravenous contrast. COMPARISON:  None Available. FINDINGS: Brain: No restricted diffusion to suggest acute or subacute infarct. No acute hemorrhage, mass, mass effect, or midline shift. No hydrocephalus or extra-axial collection. No hemosiderin deposition to suggest remote hemorrhage. Cerebral volume is normal for age. Vascular: Normal arterial flow voids. Skull and upper cervical spine: Normal marrow signal. Sinuses/Orbits: Clear paranasal sinuses. No acute finding in the orbits. Other: The mastoids are well aerated. IMPRESSION: No acute intracranial process. No evidence of acute or subacute infarct.  Electronically Signed   By: Merilyn Baba M.D.   On: 10/16/2022 22:20    Procedures Procedures    Medications Ordered in ED Medications - No data to display  ED Course/ Medical Decision Making/ A&P Clinical Course as of 10/17/22 1015  Fri Oct 16, 2022  1801 Discussed with Dr. Leonel Ramsay neurology.  He said that she needs an MRI to make sure although more likely this is some sort of migraine aura.  Patient agreeable to MRI no contraindications [MB]    Clinical Course User Index [MB] Hayden Rasmussen, MD                           Medical Decision Making Amount and/or Complexity of Data Reviewed Radiology: ordered.   This patient complains of visual disturbance;  this involves an extensive number of treatment Options and is a complaint that carries with it a high risk of complications and morbidity. The differential includes stroke, TIA, seizure, migraine, retinal detachment  I ordered, reviewed and interpreted labs, which included CBC normal chemistries normal other than mildly low bicarb I ordered imaging studies which included MRI brain and I independently    visualized and interpreted imaging which showed no acute findings Previous records obtained and reviewed in epic including recent cardiology notes I consulted neurology Dr. Lorrin Goodell And discussed lab and imaging findings and discussed disposition.  Cardiac monitoring reviewed, normal sinus rhythm Social determinants considered, no significant barriers Critical Interventions: None  After the interventions stated above, I reevaluated the patient and found patient to be alert and neuro intact Admission and further testing considered, no indication for admission or further work-up at this time.  She is comfortable plan for outpatient follow-up with her treatment team.  Return instructions discussed         Final Clinical Impression(s) / ED Diagnoses Final diagnoses:  Visual disturbance    Rx / DC Orders ED  Discharge Orders     None         Hayden Rasmussen, MD 10/17/22 1017

## 2022-10-16 NOTE — ED Triage Notes (Addendum)
Pt presents reporting she had an ablation here last week.  Wednesday night had about a 30 min period of seeing a kaleidoscope in her peripheral vision on one side.  This stopped and has not returned.  Pt states she just wanted to be checked as she is on xarelto since her ablation and was told to watch for strokes. No neuro deficits at time of triage

## 2022-10-16 NOTE — ED Notes (Signed)
Patient transported to MRI 

## 2022-10-23 ENCOUNTER — Encounter: Payer: Self-pay | Admitting: Cardiovascular Disease

## 2022-10-23 ENCOUNTER — Ambulatory Visit: Payer: BC Managed Care – PPO | Attending: Cardiovascular Disease | Admitting: Cardiovascular Disease

## 2022-10-23 VITALS — BP 136/82 | HR 70 | Ht 65.0 in | Wt 196.8 lb

## 2022-10-23 DIAGNOSIS — G4733 Obstructive sleep apnea (adult) (pediatric): Secondary | ICD-10-CM | POA: Diagnosis not present

## 2022-10-23 DIAGNOSIS — R0681 Apnea, not elsewhere classified: Secondary | ICD-10-CM

## 2022-10-23 DIAGNOSIS — E039 Hypothyroidism, unspecified: Secondary | ICD-10-CM | POA: Diagnosis not present

## 2022-10-23 DIAGNOSIS — I480 Paroxysmal atrial fibrillation: Secondary | ICD-10-CM

## 2022-10-23 DIAGNOSIS — E669 Obesity, unspecified: Secondary | ICD-10-CM

## 2022-10-23 DIAGNOSIS — E785 Hyperlipidemia, unspecified: Secondary | ICD-10-CM

## 2022-10-23 DIAGNOSIS — Z7901 Long term (current) use of anticoagulants: Secondary | ICD-10-CM

## 2022-10-23 NOTE — Progress Notes (Signed)
Cardiology Office Note    Date:  10/31/2022   ID:  Cynthia Flowers, DOB 1965-12-19, MRN [id number]  PCP:  Prince Solian, MD  Cardiologist:  Shelva Majestic, MD(sleep); Dr. Curt Bears  New sleep consult referred by Dr. Curt Bears and Malka So, PA following initiation of CPAP therapy.   History of Present Illness:  Cynthia Flowers is a 56 y.o. female who is followed by Dr. Curt Bears for EP and Dr. Dagmar Hait for primary care.  She has a history of atrial fibrillation as well as hyperlipidemia and hypothyroidism.  She had undergone cardioversion for atrial fibrillation on May 28, 2022 and was maintaining sinus rhythm and saw Dr. Curt Bears on June 16, 2022.  Due to development of recurrent atrial fibrillation, she ultimately underwent successful atrial fibrillation ablation on October 06, 2022 by Dr. Curt Bears.    Due to concerns for obstructive sleep apnea with history of snoring, obesity, witnessed apnea, and atrial fibrillation, she was referred for a home sleep study which was done on July 23, 2022 scheduled by Malka So, PA.  She was found to have severe obstructive sleep apnea with an AHI of 38.5/h.  She snored for 33 minutes of sleep and did not have oxygen desaturation with a nadir of 90%.  She ultimately initiated CPAP auto therapy and received a new ResMed AirSense 11 CPAP auto unit on August 24, 2022 with [employer] as her DME company.  Initial download was obtained today in the office which shows extremely poor compliance and she had only used the CPAP for 2 out of 60 days and only 38 minutes of average use.  Apparently she has issues with claustrophobia.  Presently she denies daytime sleepiness.  She is unaware of bruxism, hypnopompic or hypnagogic hallucinations or cataplectic events.  She is on levothyroxine 75 mcg for hypothyroidism, metoprolol succinate 25 mg for blood pressure and her A-fib history.  She is on Protonix for GERD, Xarelto 20 mg for anticoagulation and rosuvastatin 10 mg for  hyperlipidemia.  She presents for her initial sleep consultation and evaluation.   Past Medical History:  Diagnosis Date   Family history of adverse reaction to anesthesia    High cholesterol    History of migraine    Hypothyroidism    Mitral valve prolapse    mild    Past Surgical History:  Procedure Laterality Date   ATRIAL FIBRILLATION ABLATION N/A 10/06/2022   Procedure: ATRIAL FIBRILLATION ABLATION;  Surgeon: Constance Haw, MD;  Location: [facility];  Service: Cardiovascular;  Laterality: N/A;   BREAST BIOPSY Right 2013   BREAST BIOPSY Left 2002   CARDIOVERSION N/A 05/28/2022   Procedure: CARDIOVERSION;  Surgeon: Berniece Salines, DO;  Location: [facility];  Service: Cardiovascular;  Laterality: N/A;   eyelid surgery Right 2016   LAPAROSCOPIC ABDOMINAL EXPLORATION  1994   lysis of adhesions   TMJ ARTHROSCOPY  1986    Current Medications: Outpatient Medications Prior to Visit  Medication Sig Dispense Refill   acetaminophen (TYLENOL) 500 MG tablet Take 500-1,000 mg by mouth every 6 (six) hours as needed (pain.).     levothyroxine (SYNTHROID, LEVOTHROID) 75 MCG tablet Take 75 mcg by mouth daily before breakfast.     pantoprazole (PROTONIX) 40 MG tablet Take 40 mg by mouth daily as needed (gastritis).     rivaroxaban (XARELTO) 20 MG TABS tablet Take 20 mg by mouth daily with supper.     rosuvastatin (CRESTOR) 10 MG tablet Take 10 mg  by mouth in the morning.     traMADol (ULTRAM) 50 MG tablet Take 50 mg by mouth every 8 (eight) hours as needed (headache).     metoprolol succinate (TOPROL-XL) 25 MG 24 hr tablet Take 1 tablet (25 mg total) by mouth daily. (Patient taking differently: Take 25 mg by mouth every evening.) 90 tablet 1   No facility-administered medications prior to visit.     Allergies:   Patient has no known allergies.   Social History   Socioeconomic History   Marital status: Married    Spouse name: Not on file   Number of children: Not on file    Years of education: Not on file   Highest education level: Not on file  Occupational History   Not on file  Tobacco Use   Smoking status: Never   Smokeless tobacco: Never   Tobacco comments:    Never smoke 04/21/22  Substance and Sexual Activity   Alcohol use: Yes    Alcohol/week: 2.0 - 4.0 standard drinks of alcohol    Types: 2 - 4 Standard drinks or equivalent per week    Comment: 1-2 mixed drinks 1-2 a month 04/21/22   Drug use: No   Sexual activity: Not on file  Other Topics Concern   Not on file  Social History Narrative   Not on file   Social Determinants of Health   Financial Resource Strain: Not on file  Food Insecurity: Not on file  Transportation Needs: Not on file  Physical Activity: Not on file  Stress: Not on file  Social Connections: Not on file    Socially she was born in [place].  She is married for 5 years and has a daughter age 26.  Family History:  The patient's family history includes Breast cancer in her maternal aunt and maternal aunt; Colon polyps in her mother; Dementia in her father; Diabetes in her paternal grandmother; Heart disease in her mother; Prostate cancer in her father; Stroke in her maternal grandmother.  Her father died at age 72.  Her mother is currently living.  She has 1 sister who died at 18 with ovarian cancer.  ROS General: Negative; No fevers, chills, or night sweats;  HEENT: Negative; No changes in vision or hearing, sinus congestion, difficulty swallowing Pulmonary: Negative; No cough, wheezing, shortness of breath, hemoptysis Cardiovascular: See HPI GI: GERD GU: Negative; No dysuria, hematuria, or difficulty voiding Musculoskeletal: Negative; no myalgias, joint pain, or weakness Hematologic/Oncology: Negative; no easy bruising, bleeding Endocrine: Hypothyroidism Neuro: Negative; no changes in balance, headaches Skin: Negative; No rashes or skin lesions Psychiatric: Negative; No behavioral problems, depression Sleep:  Negative; No snoring, daytime sleepiness, hypersomnolence, bruxism, restless legs, hypnogognic hallucinations, no cataplexy Other comprehensive 14 point system review is negative.   PHYSICAL EXAM:   VS:  BP 136/82 (BP Location: Left Arm, Patient Position: Sitting, Cuff Size: Large)   Pulse 70   Ht _0  (1.651 m)   Wt 196 lb 12.8 oz (89.3 kg)   LMP 04/04/2011   SpO2 98%   BMI 32.75 kg/m     Repeat blood pressure by me was 128/80  Wt Readings from Last 3 Encounters:  10/23/22 196 lb 12.8 oz (89.3 kg)  10/06/22 197 lb (89.4 kg)  07/22/22 197 lb (89.4 kg)    General: Alert, oriented, no distress.  Skin: normal turgor, no rashes, warm and dry HEENT: Normocephalic, atraumatic. Pupils equal round and reactive to light; sclera anicteric; extraocular muscles intact;  Nose without nasal  septal hypertrophy Mouth/Parynx benign; Mallinpatti scale 3 Neck: No JVD, no carotid bruits; normal carotid upstroke Lungs: clear to ausculatation and percussion; no wheezing or rales Chest wall: without tenderness to palpitation Heart: PMI not displaced, RRR, s1 s2 normal, 1/6 systolic murmur, no diastolic murmur, no rubs, gallops, thrills, or heaves Abdomen: soft, nontender; no hepatosplenomehaly, BS+; abdominal aorta nontender and not dilated by palpation. Back: no CVA tenderness Pulses 2+ Musculoskeletal: full range of motion, normal strength, no joint deformities Extremities: no clubbing cyanosis or edema, Homan's sign negative  Neurologic: grossly nonfocal; Cranial nerves grossly wnl Psychologic: Normal mood and affect   Studies/Labs Reviewed:   October 23, 2022 ECG (independently read by me): NSR at 70 with mid sinus arrythmia, QTc 468 msec  Recent Labs:    Latest Ref Rng & Units 10/16/2022    3:51 PM 09/29/2022   11:51 AM 09/23/2022   11:22 AM  BMP  Glucose 70 - 99 mg/dL 106   112   BUN 6 - 20 mg/dL 17   14   Creatinine 0.44 - 1.00 mg/dL 0.87  0.80  0.74   BUN/Creat Ratio 9 - 23   19    Sodium 135 - 145 mmol/L 140   136   Potassium 3.5 - 5.1 mmol/L 4.0   4.4   Chloride 98 - 111 mmol/L 105   99   CO2 22 - 32 mmol/L 21   25   Calcium 8.9 - 10.3 mg/dL 9.8   9.8         Latest Ref Rng & Units 10/16/2022    3:51 PM 04/02/2022   12:51 PM  Hepatic Function  Total Protein 6.5 - 8.1 g/dL 7.4  7.5   Albumin 3.5 - 5.0 g/dL 4.5  4.3   AST 15 - 41 U/L 30  36   ALT 0 - 44 U/L 30  30   Alk Phosphatase 38 - 126 U/L 88  86   Total Bilirubin 0.3 - 1.2 mg/dL 0.5  0.9        Latest Ref Rng & Units 10/16/2022    3:51 PM 09/23/2022   11:22 AM 05/19/2022    3:13 PM  CBC  WBC 4.0 - 10.5 K/uL 6.4  7.1  4.0   Hemoglobin 12.0 - 15.0 g/dL 14.4  14.7  15.2   Hematocrit 36.0 - 46.0 % 43.4  42.3  46.7   Platelets 150 - 400 K/uL 268  271  195    Lab Results  Component Value Date   MCV 91.0 10/16/2022   MCV 89 09/23/2022   MCV 90.0 05/19/2022   No results found for: "TSH" No results found for: "HGBA1C"   BNP No results found for: "BNP"  ProBNP No results found for: "PROBNP"   Lipid Panel  No results found for: "CHOL", "TRIG", "HDL", "CHOLHDL", "VLDL", "LDLCALC", "LDLDIRECT", "LABVLDL"   RADIOLOGY: MR BRAIN WO CONTRAST  Result Date: 10/16/2022 CLINICAL DATA:  Stroke suspected, vision changes EXAM: MRI HEAD WITHOUT CONTRAST TECHNIQUE: Multiplanar, multiecho pulse sequences of the brain and surrounding structures were obtained without intravenous contrast. COMPARISON:  None Available. FINDINGS: Brain: No restricted diffusion to suggest acute or subacute infarct. No acute hemorrhage, mass, mass effect, or midline shift. No hydrocephalus or extra-axial collection. No hemosiderin deposition to suggest remote hemorrhage. Cerebral volume is normal for age. Vascular: Normal arterial flow voids. Skull and upper cervical spine: Normal marrow signal. Sinuses/Orbits: Clear paranasal sinuses. No acute finding in the orbits. Other: The mastoids are well  aerated. IMPRESSION: No acute  intracranial process. No evidence of acute or subacute infarct. Electronically Signed   By: Merilyn Baba M.D.   On: 10/16/2022 22:20   EP STUDY  Result Date: 10/06/2022 SURGEON:  Allegra Lai, MD PREPROCEDURE DIAGNOSES: 1. Persistent atrial fibrillation. POSTPROCEDURE DIAGNOSES: 1. Persistent atrial fibrillation. PROCEDURES: 1. Comprehensive electrophysiologic study. 2. Coronary sinus pacing and recording. 3. Three-dimensional mapping of atrial fibrillation (with additional mapping and ablation within the left atrium due to persistence of afib) 4. Ablation of atrial fibrillation (with additional mapping and ablation within the left atrium due to persistence of afib) 5. Intracardiac echocardiography. 6. Transseptal puncture of an intact septum. 7. Arrhythmia induction with pacing 8. External cardioversion. INTRODUCTION:  Cynthia Flowers is a 56 y.o. female with a history of persistent atrial fibrillation who now presents for EP study and radiofrequency ablation.  The patient reports initially being diagnosed with atrial fibrillation after presenting with symptomatic palpitations and fatgiue.  The patient has failed medical therapy.  The patient therefore presents today for catheter ablation of atrial fibrillation. DESCRIPTION OF PROCEDURE:  Informed written consent was obtained, and the patient was brought to the electrophysiology lab in a fasting state.  The patient was adequately sedated with intravenous medications as outlined in the anesthesia report.  The patient's left and right groins were prepped and draped in the usual sterile fashion by the EP lab staff.  Using a percutaneous Seldinger technique, two 8-French hemostasis sheaths were placed in the right femoral vein, and one 7 [place] and one 11-French hemostasis sheaths were placed into the left common femoral vein. An esophageal temperature probe was inserted to monitor for heating of the esophagus during the procedure.  Each sheath site was preclosed  using an Abbott Perclose and was closed at the end of the case. Direct ultrasound guidance is used for right and left femoral veins with normal vessel patency. Ultrasound images are captured and stored in the patient's chart. Using ultrasound guidance, the Brockenbrough needle and wire were visualized entering the vessel. Catheter Placement:  A 7-French Biosense [employer] Decapolar coronary sinus catheter was introduced through the right common femoral vein and advanced into the coronary sinus for recording and pacing from this location.  A luminal esophageal temperature probe was placed and used for continuous monitoring of the luminal esophageal temperature throughout the procedure as well as to localize the esophagus on fluoroscopy. In addition, the esophagus was directly visualized with intracardiac echo and its positioned marked on Carto.  During ablation at the posterior wall there was limited esophageal heating noted during RF energy delivery with the maximal temperature recorded by the luminal temperature probe of < 38.5 degrees C. Initial Measurements: The patient presented to the electrophysiology lab in sinus rhythm. her QRS 88 msec and a QT interval of 394 msec.     Intracardiac Echocardiography: An 8-French Biosense Webster AcuNav intracardiac echocardiography catheter was introduced through the right common femoral vein and advanced into the right atrium. Intracardiac echocardiography was performed of the left atrium, and a three-dimensional anatomical rendering of the left atrium was performed using CARTO sound technology.  The patient was noted to have a moderate sized left atrium.  The interatrial septum was prominent but not aneurysmal. All 4 pulmonary veins were visualized and noted to have separate ostia.  The pulmonary veins were moderate in size.  The left atrial appendage was visualized and did not reveal thrombus.   There was no evidence of pulmonary vein stenosis. Transseptal Puncture: The  right common femoral vein sheaths were exchanged for one 8.5 [employer] and one Bayless sheath and transseptal access was achieved with the Bayless in a standard fashion using a Bayless needle under fluoroscopy with intracardiac echocardiography confirmation of the transseptal puncture.  Once transseptal access had been achieved, heparin was administered intravenously and intra- arterially in order to maintain an ACT of greater than 350 seconds throughout the procedure.  3D Mapping and Ablation: A 3.5 mm Biosense [employer] ST/SF Thermocool ablation catheter was advanced into the right atrium through the Visigo sheath.  The transseptal sheath was pulled back into the IVC over a guidewire.  The ablation catheter was advanced across the transseptal hole using the wire as a guide.  The transseptal sheath was then re-advanced over the guidewire into the left atrium.  A Biosense [employer] mapping catheter was introduced through the transseptal sheath and positioned over the mouth of all 4 pulmonary veins.  Three-dimensional electroanatomical mapping was performed using CARTO technology.  This demonstrated electrical activity within all four pulmonary veins at baseline. The patient underwent successful sequential electrical isolation and anatomical encircling of all four pulmonary veins using radiofrequency current with a circular mapping catheter as a guide. A WACA approach was used. Due to persistence of atrial fibrillation, additional left atrial mapping and ablation was performed.  A series of radiofrequency lesions were delivered along the roof and floor of the left atrium in order to create a "standard box" lesion along the posterior wall of the left atrium. 20 mcg/kg/min of dobutamine was infused without arrhythmia induced. Cardioversion: The patient was then cardioverted to sinus rhythm with a single synchronized 150-J biphasic shock with cardioversion electrodes in the  anterior-posterior thoracic configuration. she remained in sinus rhythm thereafter. Measurements Following Ablation: In junctional rhythm the RR interval was 872mec, with QRS 67 msec, and QT 379 msec.  Following ablation the HV interval was 32 msec. Ventricular pacing was performed, which revealed midline decremental VA conduction with a VA Wenckebach cycle length of 300 msec. Rapid atrial pacing was performed, which revealed an AV Wenckebach cycle length of 350 msec.  Electroisolation was then again confirmed in all four pulmonary veins. The procedure was therefore considered completed.  All catheters were removed, and the sheaths were aspirated and flushed.  The patient was transferred to the recovery area for sheath removal per protocol.  Intracardiac echocardiogram revealed no pericardial effusion. EBL<114m  There were no early apparent complications. CONCLUSIONS: 1. Atrial fibrillation upon presentation.  2. Successful electrical isolation and anatomical encircling of all four pulmonary veins with radiofrequency current.  A WACA approach was used 3. Additional left atrial ablation was performed with a standard box lesion created along the posterior wall of the left atrium 4. Atrial fibrillation successfully cardioverted to sinus rhythm. 5. No early apparent complications. WiOcie Doyneamnitz,MD 11:39 AM 10/06/2022     Additional studies/ records that were reviewed today include:     Patient Name: OwDustina, Sc[place]inate: 07/23/2022 Gender: Female D.O.B: [id number] (years): [age]eferring Provider: ClMalka SoA Height (inches): 65 Interpreting Physician: ThShelva MajesticD, ABSM Weight (lbs): 197 RPSGT: NeGerhard PerchesMI: 33 MRN: [id number] Size: 13.40   CLINICAL INFORMATION Sleep Study Type: HST   Indication for sleep study: Atrial fibrillation, obesity, witnessed apnea   Epworth Sleepiness Score: 1   SLEEP STUDY TECHNIQUE A multi-channel overnight portable sleep study was  performed. The channels recorded were: nasal airflow, thoracic respiratory movement, and oxygen saturation with  a pulse oximetry. Snoring was also monitored.   MEDICATIONS levothyroxine (SYNTHROID, LEVOTHROID) 75 MCG tablet metoprolol succinate (TOPROL-XL) 25 MG 24 hr tablet pantoprazole (PROTONIX) 40 MG tablet rivaroxaban (XARELTO) 20 MG TABS tablet rosuvastatin (CRESTOR) 10 MG tablet traMADol (ULTRAM) 50 MG tablet  Patient self administered medications include: N/A.   SLEEP ARCHITECTURE Patient was studied for 371 minutes. The sleep efficiency was 100.0 % and the patient was supine for 0%. The arousal index was 0.0 per hour.   RESPIRATORY PARAMETERS The overall AHI was 38.5 per hour, with a central apnea index of 0 per hour.   The oxygen nadir was 90% during sleep.   CARDIAC DATA Mean heart rate during sleep was 75.8 bpm with a range of 60 to 106 bpm.   IMPRESSIONS - Severe obstructive sleep apnea occurred during this study (AHI 38.5/h). - The patient had no oxygen desaturation during the study (Min O2 90%) - Patient snored for 33.1 minutes (8.9%) during the sleep.   DIAGNOSIS - Obstructive Sleep Apnea (G47.33)   RECOMMENDATIONS - In this patient with cardiovascular comorbidities and severe obstructive sleep apnea recommend therapeutic CPAP for optimal treatment of her sleep disordered breathing. Recommend an in-lab titration, but if unable initiate Auto-PAP with EPR of 3 at 6 - 18 cm of water.  - Effort should be made to optimize nasal and oropharyngeal patency. - Avoid alcohol, sedatives and other CNS depressants that may worsen sleep apnea and disrupt normal sleep architecture. - Sleep hygiene should be reviewed to assess factors that may improve sleep quality. - Weight management (BMI 33) and regular exercise should be initiated or continued. - Recommend a downlaod and sleep clinic evalution after 4 weeks of therapy.    ASSESSMENT:    1. Obstructive sleep apnea (adult)  (pediatric)   2. Paroxysmal atrial fibrillation (HCC)   3. Witnessed episode of apnea   4. Mild obesity   5. Hypothyroidism, unspecified type   6. Anticoagulated   7. Hyperlipidemia, unspecified hyperlipidemia type     PLAN:  Cynthia Flowers is a 56 year old female who has a history of hypothyroidism, hyperlipidemia, and had developed atrial fibrillation leading to initial cardioversion in July 2023.  However due to recurrent atrial fibrillation she subsequently underwent successful AF ablation by Dr. Curt Bears on October 06, 2022.  Due to concerns for obstructive sleep apnea with atrial fibrillation, witnessed apnea, she had undergone a sleep study on July 23, 2022.  I reviewed this with her today and she was found to have severe overall sleep apnea with an AHI of 38.5/h.  CPAP was initiated with AutoPap therapy on August 24, 2022 and I reviewed her download data since receiving her machine.  Essentially her compliance has been nonexistent with only 2 days of use with usage at 38 minutes on average each night.  She had subsequently developed recurrent A-fib and underwent ablation therapy in November.  I had a very lengthy discussion with her today in the office.  I reviewed with her normal sleep architecture and the disruptive effects of sleep apnea on normal sleep architecture.  In addition I reviewed untreated sleep apnea is affects with reference to hypertension, nocturnal arrhythmias, increased risk for atrial fibrillation and if patients are successfully cardioverted to undergo ablation therapy reviewed data regarding increased recurrent atrial fibrillation risk if sleep apnea continues to be untreated.  In addition I reviewed its negative effects on insulin resistance, increased nocturnal GERD as well as increased inflammatory markers.  In addition I discussed potential nocturnal  hypoxemia contributing to nocturnal ischemia and also discussed its effects contributing to frequent nocturia.  I  discussed with her at the importance of meeting compliance within 90 days of CPAP onset.  She has issues with claustrophobia and I suspect she may not be compliant over the next month.  As result, I also discussed potential alternatives to CPAP therapy such as a customized oral appliance or potential surgical intervention with inspire technology.  I discussed optimal sleep duration at 7 and 9 hours and the importance of weight loss and exercise.  I will tentatively schedule her an appointment to be seen in follow-up in 1 month and further recommendations will be made at that time.   Medication Adjustments/Labs and Tests Ordered: Current medicines are reviewed at length with the patient today.  Concerns regarding medicines are outlined above.  Medication changes, Labs and Tests ordered today are listed in the Patient Instructions below. Patient Instructions  Medication Instructions:  The current medical regimen is effective;  continue present plan and medications.  *If you need a refill on your cardiac medications before your next appointment, please call your pharmacy*   Follow-Up: At [facility], you and your health needs are our priority.  As part of our continuing mission to provide you with exceptional heart care, we have created designated Provider Care Teams.  These Care Teams include your primary Cardiologist (physician) and Advanced Practice Providers (APPs -  Physician Assistants and Nurse Practitioners) who all work together to provide you with the care you need, when you need it.  We recommend signing up for the patient portal called "MyChart".  Sign up information is provided on this After Visit Summary.  MyChart is used to connect with patients for Virtual Visits (Telemedicine).  Patients are able to view lab/test results, encounter notes, upcoming appointments, etc.  Non-urgent messages can be sent to your provider as well.   To learn more about what you can do with MyChart,  go to [email or web address].    Your next appointment:   January 23rd, 2024 @ 1:40 PM   The format for your next appointment:   In Person  Provider:   Shelva Majestic, MD         Signed, Shelva Majestic, MD, [facility], [place], American Board of Sleep Medicine  10/31/2022 12:31 PM    [facility] [address], [place], [place], [place]  [address] Phone: [phone]

## 2022-10-23 NOTE — Patient Instructions (Addendum)
Medication Instructions:  The current medical regimen is effective;  continue present plan and medications.  *If you need a refill on your cardiac medications before your next appointment, please call your pharmacy*   Follow-Up: At [facility], you and your health needs are our priority.  As part of our continuing mission to provide you with exceptional heart care, we have created designated Provider Care Teams.  These Care Teams include your primary Cardiologist (physician) and Advanced Practice Providers (APPs -  Physician Assistants and Nurse Practitioners) who all work together to provide you with the care you need, when you need it.  We recommend signing up for the patient portal called "MyChart".  Sign up information is provided on this After Visit Summary.  MyChart is used to connect with patients for Virtual Visits (Telemedicine).  Patients are able to view lab/test results, encounter notes, upcoming appointments, etc.  Non-urgent messages can be sent to your provider as well.   To learn more about what you can do with MyChart, go to [email or web address].    Your next appointment:   January 23rd, 2024 @ 1:40 PM   The format for your next appointment:   In Person  Provider:   Shelva Majestic, MD

## 2022-10-24 DIAGNOSIS — G4733 Obstructive sleep apnea (adult) (pediatric): Secondary | ICD-10-CM | POA: Diagnosis not present

## 2022-10-28 ENCOUNTER — Other Ambulatory Visit (HOSPITAL_COMMUNITY): Payer: Self-pay | Admitting: Physician Assistant

## 2022-10-31 ENCOUNTER — Encounter: Payer: Self-pay | Admitting: Cardiovascular Disease

## 2022-11-03 ENCOUNTER — Ambulatory Visit (HOSPITAL_COMMUNITY)
Admission: RE | Admit: 2022-11-03 | Discharge: 2022-11-03 | Disposition: A | Discharge status: Home / Self Care | Payer: BC Managed Care – PPO | Source: Ambulatory Visit | Attending: Physician Assistant | Admitting: Physician Assistant

## 2022-11-03 ENCOUNTER — Encounter (HOSPITAL_COMMUNITY): Payer: Self-pay | Admitting: Physician Assistant

## 2022-11-03 VITALS — BP 144/88 | HR 65 | Ht 65.0 in | Wt 196.8 lb

## 2022-11-03 DIAGNOSIS — E669 Obesity, unspecified: Secondary | ICD-10-CM | POA: Diagnosis not present

## 2022-11-03 DIAGNOSIS — I4819 Other persistent atrial fibrillation: Secondary | ICD-10-CM | POA: Diagnosis not present

## 2022-11-03 DIAGNOSIS — G4733 Obstructive sleep apnea (adult) (pediatric): Secondary | ICD-10-CM | POA: Diagnosis not present

## 2022-11-03 DIAGNOSIS — E785 Hyperlipidemia, unspecified: Secondary | ICD-10-CM | POA: Diagnosis not present

## 2022-11-03 DIAGNOSIS — Z6832 Body mass index (BMI) 32.0-32.9, adult: Secondary | ICD-10-CM | POA: Insufficient documentation

## 2022-11-03 DIAGNOSIS — Z7901 Long term (current) use of anticoagulants: Secondary | ICD-10-CM | POA: Insufficient documentation

## 2022-11-03 DIAGNOSIS — E039 Hypothyroidism, unspecified: Secondary | ICD-10-CM | POA: Diagnosis not present

## 2022-11-03 NOTE — Progress Notes (Signed)
Primary Care Physician: Prince Solian, MD Primary Cardiologist: none Primary Electrophysiologist: Dr Curt Bears  Referring Physician: Dr Leone Brand is a 56 y.o. female with a history of HLD, OSA, hypothyroidism, atrial fibrillation who presents for follow up in the [facility].  The patient was initially diagnosed with atrial fibrillation 04/15/22 after presenting to her PCP for a routine visit. She admits she has had palpitations off and on for about 3 years. She has several family members with afib, her mother is a patient of Dr Curt Bears. There does not seem to be any particular trigger for her palpitations. Patient has a CHADS2VASC score of 1. She does admit to snoring and witnessed apnea. Patient wore a cardiac monitor which showed 100% afib burden avg HR 97 bpm with rapid rates at times.   Patient is s/p DCCV on 05/28/22 but had quick return of her afib. She had a sleep study and was diagnosed with severe OSA. Patient was seen by Dr Curt Bears and underwent an afib ablation on 10/06/22.  On follow up today, she was seen at the ED 10/16/22 with acute vision changes. Given her recent ablation, there was concern for CVA. Workup at that time was negative for CVA, felt to be more likely a migraine with aura. Patient reports that she has had two very brief palpitations but has been staying in SR. She denies chest pain, swallowing pain, or groin issues.   Today, she denies symptoms of chest pain, shortness of breath, orthopnea, PND, lower extremity edema, dizziness, presyncope, syncope, bleeding, or neurologic sequela. The patient is tolerating medications without difficulties and is otherwise without complaint today.    Atrial Fibrillation Risk Factors:  she does have symptoms or diagnosis of sleep apnea. she is adjusting to CPAP, not compliant yet.  she does not have a history of rheumatic fever. she does not have a history of alcohol use. The patient does  have a history of early familial atrial fibrillation or other arrhythmias. Many family members have afib.  she has a BMI of Body mass index is 32.75 kg/m.Marland Kitchen Filed Weights   11/03/22 1317  Weight: 89.3 kg    Family History  Problem Relation Age of Onset   Heart disease Mother    Colon polyps Mother    Prostate cancer Father    Dementia Father    Breast cancer Maternal Aunt        in 68's   Diabetes Paternal Grandmother    Stroke Maternal Grandmother    Breast cancer Maternal Aunt        in 70's   Colon cancer Neg Hx      Atrial Fibrillation Management history:  Previous antiarrhythmic drugs: none Previous cardioversions: 05/28/22 Previous ablations: 10/06/22 CHADS2VASC score: 1 Anticoagulation history: Xarelto    Past Medical History:  Diagnosis Date   Family history of adverse reaction to anesthesia    High cholesterol    History of migraine    Hypothyroidism    Mitral valve prolapse    mild   Past Surgical History:  Procedure Laterality Date   ATRIAL FIBRILLATION ABLATION N/A 10/06/2022   Procedure: ATRIAL FIBRILLATION ABLATION;  Surgeon: Constance Haw, MD;  Location: [facility];  Service: Cardiovascular;  Laterality: N/A;   BREAST BIOPSY Right 2013   BREAST BIOPSY Left 2002   CARDIOVERSION N/A 05/28/2022   Procedure: CARDIOVERSION;  Surgeon: Berniece Salines, DO;  Location: [facility];  Service: Cardiovascular;  Laterality: N/A;  eyelid surgery Right 2016   LAPAROSCOPIC ABDOMINAL EXPLORATION  1994   lysis of adhesions   TMJ ARTHROSCOPY  1986    Current Outpatient Medications  Medication Sig Dispense Refill   acetaminophen (TYLENOL) 500 MG tablet Take 500-1,000 mg by mouth every 6 (six) hours as needed (pain.).     levothyroxine (SYNTHROID, LEVOTHROID) 75 MCG tablet Take 75 mcg by mouth daily before breakfast.     metoprolol succinate (TOPROL-XL) 25 MG 24 hr tablet TAKE 1 TABLET (25 MG TOTAL) BY MOUTH DAILY. 90 tablet 1   pantoprazole (PROTONIX)  40 MG tablet Take 40 mg by mouth daily as needed (gastritis).     rivaroxaban (XARELTO) 20 MG TABS tablet Take 20 mg by mouth daily with supper.     rosuvastatin (CRESTOR) 10 MG tablet Take 10 mg by mouth in the morning.     traMADol (ULTRAM) 50 MG tablet Take 50 mg by mouth every 8 (eight) hours as needed (headache).     No current facility-administered medications for this encounter.    No Known Allergies  Social History   Socioeconomic History   Marital status: Married    Spouse name: Not on file   Number of children: Not on file   Years of education: Not on file   Highest education level: Not on file  Occupational History   Not on file  Tobacco Use   Smoking status: Never   Smokeless tobacco: Never   Tobacco comments:    Never smoke 04/21/22  Substance and Sexual Activity   Alcohol use: Yes    Alcohol/week: 2.0 - 4.0 standard drinks of alcohol    Types: 2 - 4 Standard drinks or equivalent per week    Comment: 1-2 mixed drinks 1-2 a month 04/21/22   Drug use: No   Sexual activity: Not on file  Other Topics Concern   Not on file  Social History Narrative   Not on file   Social Determinants of Health   Financial Resource Strain: Not on file  Food Insecurity: Not on file  Transportation Needs: Not on file  Physical Activity: Not on file  Stress: Not on file  Social Connections: Not on file  Intimate Partner Violence: Not on file     ROS- All systems are reviewed and negative except as per the HPI above.  Physical Exam: Vitals:   11/03/22 1317  BP: (!) 144/88  Pulse: 65  Weight: 89.3 kg  Height: '5\' 5"'$  (1.651 m)     GEN- The patient is a well appearing female, alert and oriented x 3 today.   HEENT-head normocephalic, atraumatic, sclera clear, conjunctiva pink, hearing intact, trachea midline. Lungs- Clear to ausculation bilaterally, normal work of breathing Heart- Regular rate and rhythm, no murmurs, rubs or gallops  GI- soft, NT, ND, + BS Extremities-  no clubbing, cyanosis, or edema MS- no significant deformity or atrophy Skin- no rash or lesion Psych- euthymic mood, full affect Neuro- strength and sensation are intact   Wt Readings from Last 3 Encounters:  11/03/22 89.3 kg  10/23/22 89.3 kg  10/06/22 89.4 kg    EKG today demonstrates  SR Vent. rate 65 BPM PR interval 140 ms QRS duration 84 ms QT/QTcB 448/465 ms  Echo 05/05/22  1. Left ventricular ejection fraction, by estimation, is 60 to 65%. The  left ventricle has normal function. The left ventricle has no regional  wall motion abnormalities. Diastolic function indeterminant due to Afib.   2. Right ventricular systolic function  is normal. The right ventricular  size is mildly enlarged. There is normal pulmonary artery systolic  pressure. The estimated right ventricular systolic pressure is 75.1 mmHg.   3. The mitral valve is normal in structure. Trivial mitral valve  regurgitation.   4. The aortic valve is tricuspid. Aortic valve regurgitation is not  visualized. No aortic stenosis is present.   5. Aortic dilatation noted. There is borderline dilatation of the  ascending aorta, measuring 38 mm.   6. The inferior vena cava is dilated in size with >50% respiratory  variability, suggesting right atrial pressure of 8 mmHg.   Comparison(s): No prior Echocardiogram.   Epic records are reviewed at length today  CHA2DS2-VASc Score = 1  The patient's score is based upon: CHF History: 0 HTN History: 0 Diabetes History: 0 Stroke History: 0 Vascular Disease History: 0 Age Score: 0 Gender Score: 1       ASSESSMENT AND PLAN: 1. Persistent atrial fibrillation  The patient's CHA2DS2-VASc score is 1, indicating a 0.6% annual risk of stroke.   S/p afib ablation 10/06/22 Patient appears to be maintaining SR.  Continue Xarelto 20 mg daily with no missed doses for 3 months post ablation.  Continue Toprol 25 mg daily   2. Obesity Body mass index is 32.75 kg/m. Lifestyle  modification was discussed and encouraged including regular physical activity and weight reduction.  3. OSA Severe on sleep study 07/22/22 The importance of adequate treatment of sleep apnea was discussed today in order to improve our ability to maintain sinus rhythm long term. Patient trying to get use to mask, she has significant claustrophobia. She is also interested in oral device and plans to revisit with Dr Claiborne Billings in January.    Follow up with Dr Curt Bears as scheduled.    [facility] [address] [place], [place] [address] [phone] 11/03/2022 1:39 PM

## 2022-11-04 DIAGNOSIS — L820 Inflamed seborrheic keratosis: Secondary | ICD-10-CM | POA: Diagnosis not present

## 2022-11-04 DIAGNOSIS — L570 Actinic keratosis: Secondary | ICD-10-CM | POA: Diagnosis not present

## 2022-11-04 DIAGNOSIS — X32XXXD Exposure to sunlight, subsequent encounter: Secondary | ICD-10-CM | POA: Diagnosis not present

## 2022-11-04 DIAGNOSIS — Z1283 Encounter for screening for malignant neoplasm of skin: Secondary | ICD-10-CM | POA: Diagnosis not present

## 2022-11-04 DIAGNOSIS — L821 Other seborrheic keratosis: Secondary | ICD-10-CM | POA: Diagnosis not present

## 2022-11-04 DIAGNOSIS — D225 Melanocytic nevi of trunk: Secondary | ICD-10-CM | POA: Diagnosis not present

## 2022-11-06 ENCOUNTER — Telehealth: Payer: Self-pay | Admitting: *Deleted

## 2022-11-06 NOTE — Telephone Encounter (Signed)
Patient called in to me to say that per her last office visit with Dr Claiborne Billings he told her that if she continues being unable to tolerate the CPAP he would refer her out for a oral appliance. She requests to have her referral sent to Dr Elta Guadeloupe Katz,DDS. Rreferral for oral appliance has been sent to Dr Ron Parker via fax.

## 2022-11-12 DIAGNOSIS — R21 Rash and other nonspecific skin eruption: Secondary | ICD-10-CM | POA: Diagnosis not present

## 2022-12-01 DIAGNOSIS — I4891 Unspecified atrial fibrillation: Secondary | ICD-10-CM | POA: Diagnosis not present

## 2022-12-02 ENCOUNTER — Telehealth: Payer: Self-pay | Admitting: *Deleted

## 2022-12-02 NOTE — Telephone Encounter (Signed)
Patient called in to advise that she has returned her CPAP machine and now has a oral appliance. She states that per Dr Claiborne Billings at her last visit that if she did switch to the oral appliance she will not need to keep her follow up appointment with him. Patient requests for me to cancel her January appointment.

## 2022-12-03 DIAGNOSIS — H02831 Dermatochalasis of right upper eyelid: Secondary | ICD-10-CM | POA: Diagnosis not present

## 2022-12-03 DIAGNOSIS — H02834 Dermatochalasis of left upper eyelid: Secondary | ICD-10-CM | POA: Diagnosis not present

## 2022-12-03 DIAGNOSIS — H40023 Open angle with borderline findings, high risk, bilateral: Secondary | ICD-10-CM | POA: Diagnosis not present

## 2022-12-15 ENCOUNTER — Ambulatory Visit: Payer: BC Managed Care – PPO | Admitting: Cardiovascular Disease

## 2023-01-04 ENCOUNTER — Ambulatory Visit: Payer: BC Managed Care – PPO | Attending: Cardiology | Admitting: Cardiology

## 2023-01-04 ENCOUNTER — Encounter: Payer: Self-pay | Admitting: Cardiology

## 2023-01-04 VITALS — BP 120/84 | HR 66 | Ht 65.0 in | Wt 198.0 lb

## 2023-01-04 DIAGNOSIS — I4819 Other persistent atrial fibrillation: Secondary | ICD-10-CM

## 2023-01-04 DIAGNOSIS — G4733 Obstructive sleep apnea (adult) (pediatric): Secondary | ICD-10-CM | POA: Diagnosis not present

## 2023-01-04 NOTE — Patient Instructions (Signed)
Medication Instructions:  Your physician has recommended you make the following change in your medication:  STOP Xarelto STOP Metoprolol  *If you need a refill on your cardiac medications before your next appointment, please call your pharmacy*   Lab Work: None ordered   Testing/Procedures: None ordered   Follow-Up: At [facility], you and your health needs are our priority.  As part of our continuing mission to provide you with exceptional heart care, we have created designated Provider Care Teams.  These Care Teams include your primary Cardiologist (physician) and Advanced Practice Providers (APPs -  Physician Assistants and Nurse Practitioners) who all work together to provide you with the care you need, when you need it.  Your next appointment:   6 month(s)  The format for your next appointment:   In Person  Provider:   You will follow up in the [facility] located at [facility]. Your provider will be: Clint R. Fenton, PA-C    Thank you for choosing [facility]!!   Trinidad Curet, RN [phone]

## 2023-01-04 NOTE — Progress Notes (Signed)
Electrophysiology Office Note   Date:  01/04/2023   ID:  Cynthia Flowers, DOB Nov 08, 1966, MRN [id number]  PCP:  Prince Solian, MD  Cardiologist:   Primary Electrophysiologist:  Jaylaa Gallion Meredith Leeds, MD    Chief Complaint: AF   History of Present Illness: Cynthia Flowers is a 57 y.o. female who is being seen today for the evaluation of AF at the request of Avva, Ravisankar, MD. Presenting today for electrophysiology evaluation.  He has a history significant hyperlipidemia, hypothyroidism, atrial fibrillation.  She was diagnosed with atrial fibrillation 04/15/2022.  She had been having palpitations for 3 years prior to that.  She is now status post ablation 10/06/2022.  Today, denies symptoms of palpitations, chest pain, shortness of breath, orthopnea, PND, lower extremity edema, claudication, dizziness, presyncope, syncope, bleeding, or neurologic sequela. The patient is tolerating medications without difficulties.  Since her ablation she has done well.  She has noted no further episodes of atrial fibrillation.  She has less fatigue and shortness of breath.    Past Medical History:  Diagnosis Date   Family history of adverse reaction to anesthesia    High cholesterol    History of migraine    Hypothyroidism    Mitral valve prolapse    mild   Past Surgical History:  Procedure Laterality Date   ATRIAL FIBRILLATION ABLATION N/A 10/06/2022   Procedure: ATRIAL FIBRILLATION ABLATION;  Surgeon: Constance Haw, MD;  Location: [facility];  Service: Cardiovascular;  Laterality: N/A;   BREAST BIOPSY Right 2013   BREAST BIOPSY Left 2002   CARDIOVERSION N/A 05/28/2022   Procedure: CARDIOVERSION;  Surgeon: Berniece Salines, DO;  Location: [facility];  Service: Cardiovascular;  Laterality: N/A;   eyelid surgery Right 2016   LAPAROSCOPIC ABDOMINAL EXPLORATION  1994   lysis of adhesions   TMJ ARTHROSCOPY  1986     Current Outpatient Medications  Medication Sig Dispense Refill    acetaminophen (TYLENOL) 500 MG tablet Take 500-1,000 mg by mouth every 6 (six) hours as needed (pain.).     levothyroxine (SYNTHROID, LEVOTHROID) 75 MCG tablet Take 75 mcg by mouth daily before breakfast.     metoprolol succinate (TOPROL-XL) 25 MG 24 hr tablet TAKE 1 TABLET (25 MG TOTAL) BY MOUTH DAILY. 90 tablet 1   pantoprazole (PROTONIX) 40 MG tablet Take 40 mg by mouth daily as needed (gastritis).     rivaroxaban (XARELTO) 20 MG TABS tablet Take 20 mg by mouth daily with supper.     rosuvastatin (CRESTOR) 10 MG tablet Take 10 mg by mouth in the morning.     traMADol (ULTRAM) 50 MG tablet Take 50 mg by mouth every 8 (eight) hours as needed (headache).     No current facility-administered medications for this visit.    Allergies:   Patient has no known allergies.   Social History:  The patient  reports that she has never smoked. She has never used smokeless tobacco. She reports current alcohol use of about 2.0 - 4.0 standard drinks of alcohol per week. She reports that she does not use drugs.   Family History:  The patient's family history includes Breast cancer in her maternal aunt and maternal aunt; Colon polyps in her mother; Dementia in her father; Diabetes in her paternal grandmother; Heart disease in her mother; Prostate cancer in her father; Stroke in her maternal grandmother.   ROS:  Please see the history of present illness.   Otherwise, review of systems is positive for none.  All other systems are reviewed and negative.   PHYSICAL EXAM: VS:  BP 120/84   Pulse 66   Ht 5' 5"$  (1.651 m)   Wt 198 lb (89.8 kg)   LMP 04/04/2011   SpO2 99%   BMI 32.95 kg/m  , BMI Body mass index is 32.95 kg/m. GEN: Well nourished, well developed, in no acute distress  HEENT: normal  Neck: no JVD, carotid bruits, or masses Cardiac: RRR; no murmurs, rubs, or gallops,no edema  Respiratory:  clear to auscultation bilaterally, normal work of breathing GI: soft, nontender, nondistended, + BS MS: no  deformity or atrophy  Skin: warm and dry Neuro:  Strength and sensation are intact Psych: euthymic mood, full affect  EKG:  EKG is ordered today. Personal review of the ekg ordered shows sinus rhythm   Recent Labs: 10/16/2022: ALT 30; BUN 17; Creatinine, Ser 0.87; Hemoglobin 14.4; Platelets 268; Potassium 4.0; Sodium 140    Lipid Panel  No results found for: "CHOL", "TRIG", "HDL", "CHOLHDL", "VLDL", "LDLCALC", "LDLDIRECT"   Wt Readings from Last 3 Encounters:  01/04/23 198 lb (89.8 kg)  11/03/22 196 lb 12.8 oz (89.3 kg)  10/23/22 196 lb 12.8 oz (89.3 kg)      Other studies Reviewed: Additional studies/ records that were reviewed today include: TTE 05/05/22  Review of the above records today demonstrates:   1. Left ventricular ejection fraction, by estimation, is 60 to 65%. The  left ventricle has normal function. The left ventricle has no regional  wall motion abnormalities. Diastolic function indeterminant due to Afib.   2. Right ventricular systolic function is normal. The right ventricular  size is mildly enlarged. There is normal pulmonary artery systolic  pressure. The estimated right ventricular systolic pressure is [id number] mmHg.   3. The mitral valve is normal in structure. Trivial mitral valve  regurgitation.   4. The aortic valve is tricuspid. Aortic valve regurgitation is not  visualized. No aortic stenosis is present.   5. Aortic dilatation noted. There is borderline dilatation of the  ascending aorta, measuring 38 mm.   6. The inferior vena cava is dilated in size with >50% respiratory  variability, suggesting right atrial pressure of 8 mmHg.    ASSESSMENT AND PLAN:  1.  Persistent atrial fibrillation: CHA2DS2-VASc of 1.  Currently on Xarelto 20 mg daily, metoprolol 25 mg twice daily.  Is status post ablation 12/06/2021.Has remained in sinus rhythm.  No further episodes of atrial fibrillation.  Millee Denise stop Xarelto and metoprolol.  2.  Obesity: Lifestyle modification  encouraged Body mass index is 32.95 kg/m.  3.  Obstructive sleep apnea: Has been fitted for a mouthpiece.  Compliance encouraged.   Current medicines are reviewed at length with the patient today.   The patient does not have concerns regarding her medicines.  The following changes were made today:  none  Labs/ tests ordered today include:  Orders Placed This Encounter  Procedures   EKG 12-Lead     Disposition:   FU 6 months  Signed, Nikola Blackston Meredith Leeds, MD  01/04/2023 2:29 PM     [facility] [address] [place] [address] [phone] (office) [phone] (fax)

## 2023-03-23 DIAGNOSIS — G4733 Obstructive sleep apnea (adult) (pediatric): Secondary | ICD-10-CM | POA: Diagnosis not present

## 2023-04-28 DIAGNOSIS — Z01419 Encounter for gynecological examination (general) (routine) without abnormal findings: Secondary | ICD-10-CM | POA: Diagnosis not present

## 2023-04-28 DIAGNOSIS — Z6833 Body mass index (BMI) 33.0-33.9, adult: Secondary | ICD-10-CM | POA: Diagnosis not present

## 2023-05-04 DIAGNOSIS — R7989 Other specified abnormal findings of blood chemistry: Secondary | ICD-10-CM | POA: Diagnosis not present

## 2023-05-04 DIAGNOSIS — E039 Hypothyroidism, unspecified: Secondary | ICD-10-CM | POA: Diagnosis not present

## 2023-05-04 DIAGNOSIS — E785 Hyperlipidemia, unspecified: Secondary | ICD-10-CM | POA: Diagnosis not present

## 2023-05-08 DIAGNOSIS — Z0000 Encounter for general adult medical examination without abnormal findings: Secondary | ICD-10-CM | POA: Diagnosis not present

## 2023-05-11 DIAGNOSIS — Z1339 Encounter for screening examination for other mental health and behavioral disorders: Secondary | ICD-10-CM | POA: Diagnosis not present

## 2023-05-11 DIAGNOSIS — E039 Hypothyroidism, unspecified: Secondary | ICD-10-CM | POA: Diagnosis not present

## 2023-05-11 DIAGNOSIS — R82998 Other abnormal findings in urine: Secondary | ICD-10-CM | POA: Diagnosis not present

## 2023-05-11 DIAGNOSIS — Z0000 Encounter for general adult medical examination without abnormal findings: Secondary | ICD-10-CM | POA: Diagnosis not present

## 2023-05-11 DIAGNOSIS — Z1331 Encounter for screening for depression: Secondary | ICD-10-CM | POA: Diagnosis not present

## 2023-05-25 ENCOUNTER — Encounter: Payer: Self-pay | Admitting: Internal Medicine

## 2023-06-02 DIAGNOSIS — H5213 Myopia, bilateral: Secondary | ICD-10-CM | POA: Diagnosis not present

## 2023-06-02 DIAGNOSIS — H52203 Unspecified astigmatism, bilateral: Secondary | ICD-10-CM | POA: Diagnosis not present

## 2023-06-02 DIAGNOSIS — H40013 Open angle with borderline findings, low risk, bilateral: Secondary | ICD-10-CM | POA: Diagnosis not present

## 2023-07-05 ENCOUNTER — Encounter (HOSPITAL_COMMUNITY): Payer: Self-pay | Admitting: Physician Assistant

## 2023-07-05 ENCOUNTER — Ambulatory Visit (HOSPITAL_COMMUNITY)
Admission: RE | Admit: 2023-07-05 | Discharge: 2023-07-05 | Disposition: A | Discharge status: Home / Self Care | Payer: BC Managed Care – PPO | Source: Ambulatory Visit | Attending: Physician Assistant | Admitting: Physician Assistant

## 2023-07-05 ENCOUNTER — Ambulatory Visit (HOSPITAL_COMMUNITY): Payer: BC Managed Care – PPO | Admitting: Physician Assistant

## 2023-07-05 VITALS — BP 136/84 | HR 67 | Ht 65.0 in | Wt 197.8 lb

## 2023-07-05 DIAGNOSIS — G4733 Obstructive sleep apnea (adult) (pediatric): Secondary | ICD-10-CM | POA: Insufficient documentation

## 2023-07-05 DIAGNOSIS — I4819 Other persistent atrial fibrillation: Secondary | ICD-10-CM | POA: Insufficient documentation

## 2023-07-05 NOTE — Progress Notes (Signed)
Primary Care Physician: Chilton Greathouse, MD Primary Cardiologist: none Primary Electrophysiologist: Dr Elberta Fortis  Referring Physician: Dr Rachel Bo is a 57 y.o. female with a history of HLD, OSA, hypothyroidism, atrial fibrillation who presents for follow up in the [facility].  The patient was initially diagnosed with atrial fibrillation 04/15/22 after presenting to her PCP for a routine visit. She admits she has had palpitations off and on for about 3 years. She has several family members with afib, her mother is a patient of Dr Elberta Fortis. There does not seem to be any particular trigger for her palpitations. Patient has a CHADS2VASC score of 1. She does admit to snoring and witnessed apnea. Patient wore a cardiac monitor which showed 100% afib burden avg HR 97 bpm with rapid rates at times.   Patient is s/p DCCV on 05/28/22 but had quick return of her afib. She had a sleep study and was diagnosed with severe OSA. Patient was seen by Dr Elberta Fortis and underwent an afib ablation on 10/06/22.  On follow up today, patient reports that she has done very well since her last visit. She has not had any interim symptoms of afib. She is using an oral appliance for her OSA and feels more energetic.   Today, she denies symptoms of palpitations, chest pain, shortness of breath, orthopnea, PND, lower extremity edema, dizziness, presyncope, syncope, bleeding, or neurologic sequela. The patient is tolerating medications without difficulties and is otherwise without complaint today.    Atrial Fibrillation Risk Factors:  she does have symptoms or diagnosis of sleep apnea. she is compliant with oral appliance.  she does not have a history of rheumatic fever. she does not have a history of alcohol use. The patient does have a history of early familial atrial fibrillation or other arrhythmias. Many family members have afib.   Atrial Fibrillation Management  history:  Previous antiarrhythmic drugs: none Previous cardioversions: 05/28/22 Previous ablations: 10/06/22 Anticoagulation history: Xarelto    Past Medical History:  Diagnosis Date   Family history of adverse reaction to anesthesia    High cholesterol    History of migraine    Hypothyroidism    Mitral valve prolapse    mild    Current Outpatient Medications  Medication Sig Dispense Refill   acetaminophen (TYLENOL) 500 MG tablet Take 500-1,000 mg by mouth every 6 (six) hours as needed (pain.).     levothyroxine (SYNTHROID, LEVOTHROID) 75 MCG tablet Take 75 mcg by mouth daily before breakfast.     pantoprazole (PROTONIX) 40 MG tablet Take 40 mg by mouth daily as needed (gastritis).     rosuvastatin (CRESTOR) 10 MG tablet Take 10 mg by mouth in the morning.     traMADol (ULTRAM) 50 MG tablet Take 50 mg by mouth every 8 (eight) hours as needed (headache).     No current facility-administered medications for this encounter.    ROS- All systems are reviewed and negative except as per the HPI above.  Physical Exam: Vitals:   07/05/23 1309  BP: 136/84  Pulse: 67  Weight: 89.7 kg  Height: 5\' 5"  (1.651 m)   GEN: Well nourished, well developed in no acute distress NECK: No JVD; No carotid bruits CARDIAC: Regular rate and rhythm, no murmurs, rubs, gallops RESPIRATORY:  Clear to auscultation without rales, wheezing or rhonchi  ABDOMEN: Soft, non-tender, non-distended EXTREMITIES:  No edema; No deformity    Wt Readings from Last 3 Encounters:  07/05/23 89.7 kg  01/04/23 89.8 kg  11/03/22 89.3 kg    EKG today demonstrates  SR Vent. rate 67 BPM PR interval 136 ms QRS duration 82 ms QT/QTcB 416/439 ms  Echo 05/05/22  1. Left ventricular ejection fraction, by estimation, is 60 to 65%. The  left ventricle has normal function. The left ventricle has no regional  wall motion abnormalities. Diastolic function indeterminant due to Afib.   2. Right ventricular systolic function  is normal. The right ventricular  size is mildly enlarged. There is normal pulmonary artery systolic  pressure. The estimated right ventricular systolic pressure is 25.5 mmHg.   3. The mitral valve is normal in structure. Trivial mitral valve  regurgitation.   4. The aortic valve is tricuspid. Aortic valve regurgitation is not  visualized. No aortic stenosis is present.   5. Aortic dilatation noted. There is borderline dilatation of the  ascending aorta, measuring 38 mm.   6. The inferior vena cava is dilated in size with >50% respiratory  variability, suggesting right atrial pressure of 8 mmHg.   Comparison(s): No prior Echocardiogram.   Epic records are reviewed at length today  CHA2DS2-VASc Score = 1  The patient's score is based upon: CHF History: 0 HTN History: 0 Diabetes History: 0 Stroke History: 0 Vascular Disease History: 0 Age Score: 0 Gender Score: 1        ASSESSMENT AND PLAN: Persistent atrial fibrillation  The patient's CHA2DS2-VASc score is 1, indicating a 0.6% annual risk of stroke.   S/p afib ablation 10/06/22 Patient appears to be maintaining SR Currently off anticoagulation with low CV score.  OSA Severe on sleep study 07/22/22 Intolerant of CPAP Seen by Dr Myrtis Ser, has oral appliance   Follow up in the AF clinic in one year.    Jorja Loa PA-C [facility] [address] [place], [place] [address] [phone] 07/05/2023 1:45 PM

## 2023-07-09 ENCOUNTER — Other Ambulatory Visit: Payer: Self-pay | Admitting: Obstetrics and Gynecology

## 2023-07-09 DIAGNOSIS — Z1231 Encounter for screening mammogram for malignant neoplasm of breast: Secondary | ICD-10-CM

## 2023-08-02 ENCOUNTER — Telehealth: Payer: Self-pay | Admitting: *Deleted

## 2023-08-02 NOTE — Telephone Encounter (Signed)
PT returned call to inform us she has been off Xarelto since February

## 2023-08-02 NOTE — Telephone Encounter (Signed)
Left VM for patient requesting clarification is she is still taking Xarelto? If she is, advised patient to make an office appointment before proceeding with a colonoscopy.  If she is not taking Xarelto, she can proceed with her pre visit on 08/06/23.

## 2023-08-06 ENCOUNTER — Ambulatory Visit: Payer: BC Managed Care – PPO

## 2023-08-06 VITALS — Ht 65.0 in | Wt 199.0 lb

## 2023-08-06 DIAGNOSIS — Z86010 Personal history of colonic polyps: Secondary | ICD-10-CM

## 2023-08-06 MED ORDER — NA SULFATE-K SULFATE-MG SULF 17.5-3.13-1.6 GM/177ML PO SOLN: 1.0000 | Freq: Once | ORAL | 0 refills | Status: AC

## 2023-08-06 NOTE — Progress Notes (Signed)
No egg or soy allergy known to patient  No issues known to pt with past sedation with any surgeries or procedures Patient denies ever being told they had issues or difficulty with intubation  No FH of Malignant Hyperthermia Pt is not on diet pills Pt is not on  home 02  Pt is not on blood thinners  Pt denies issues with constipation  No A fib- had ablation 09/2022  No- A flutter Have any cardiac testing pending--no Pt can ambulate independently Pt denies use of chewing tobacco Discussed diabetic I weight loss medication holds Discussed NSAID holds Checked BMI Pt instructed to use Single[employer]com or GoodRx for a price reduction on prep  Patient's chart reviewed by Cathlyn Parsons CNRA prior to previsit and patient appropriate for the [facility].  Pre visit completed and red dot placed by patient's name on their procedure day (on provider's schedule).

## 2023-08-09 ENCOUNTER — Encounter: Payer: Self-pay | Admitting: Internal Medicine

## 2023-08-20 ENCOUNTER — Encounter: Payer: Self-pay | Admitting: Internal Medicine

## 2023-08-20 ENCOUNTER — Ambulatory Visit (AMBULATORY_SURGERY_CENTER): Payer: BC Managed Care – PPO | Admitting: Internal Medicine

## 2023-08-20 VITALS — BP 129/73 | HR 52 | Temp 98.8°F | Resp 14 | Ht 65.0 in | Wt 199.0 lb

## 2023-08-20 DIAGNOSIS — K621 Rectal polyp: Secondary | ICD-10-CM | POA: Diagnosis not present

## 2023-08-20 DIAGNOSIS — Z09 Encounter for follow-up examination after completed treatment for conditions other than malignant neoplasm: Secondary | ICD-10-CM | POA: Diagnosis not present

## 2023-08-20 DIAGNOSIS — D128 Benign neoplasm of rectum: Secondary | ICD-10-CM

## 2023-08-20 DIAGNOSIS — Z86010 Personal history of colonic polyps: Secondary | ICD-10-CM | POA: Diagnosis not present

## 2023-08-20 DIAGNOSIS — K635 Polyp of colon: Secondary | ICD-10-CM

## 2023-08-20 DIAGNOSIS — Z1211 Encounter for screening for malignant neoplasm of colon: Secondary | ICD-10-CM | POA: Diagnosis not present

## 2023-08-20 MED ORDER — SODIUM CHLORIDE 0.9 % IV SOLN: 500.0000 mL | Freq: Once | INTRAVENOUS | Status: DC

## 2023-08-20 NOTE — Progress Notes (Signed)
GASTROENTEROLOGY PROCEDURE H&P NOTE   Primary Care Physician: Chilton Greathouse, MD    Reason for Procedure:  History of adenomatous colon polyps  Plan:    Colonoscopy  Patient is appropriate for endoscopic procedure(s) in the ambulatory ([facility]) setting.  The nature of the procedure, as well as the risks, benefits, and alternatives were carefully and thoroughly reviewed with the patient. Ample time for discussion and questions allowed. The patient understood, was satisfied, and agreed to proceed.     HPI: Cynthia Flowers is a 57 y.o. female who presents for colonoscopy.  Medical history as below.  Tolerated the prep.  No recent chest pain or shortness of breath.  No abdominal pain today.  Past Medical History:  Diagnosis Date   Allergy    Family history of adverse reaction to anesthesia    High cholesterol    History of migraine    Hypothyroidism    Mitral valve prolapse    mild   Sleep apnea     Past Surgical History:  Procedure Laterality Date   ATRIAL FIBRILLATION ABLATION N/A 10/06/2022   Procedure: ATRIAL FIBRILLATION ABLATION;  Surgeon: Regan Lemming, MD;  Location: [facility];  Service: Cardiovascular;  Laterality: N/A;   BREAST BIOPSY Right 2013   BREAST BIOPSY Left 2002   CARDIOVERSION N/A 05/28/2022   Procedure: CARDIOVERSION;  Surgeon: Thomasene Ripple, DO;  Location: [facility];  Service: Cardiovascular;  Laterality: N/A;   eyelid surgery Right 2016   LAPAROSCOPIC ABDOMINAL EXPLORATION  1994   lysis of adhesions   TMJ ARTHROSCOPY  1986    Prior to Admission medications   Medication Sig Start Date End Date Taking? Authorizing Provider  acetaminophen (TYLENOL) 500 MG tablet Take 500-1,000 mg by mouth every 6 (six) hours as needed (pain.).   Yes [provider]  levothyroxine (SYNTHROID, LEVOTHROID) 75 MCG tablet Take 75 mcg by mouth daily before breakfast.   Yes [provider]  rosuvastatin (CRESTOR) 10 MG tablet Take 10 mg by  mouth in the morning. 02/09/22  Yes [provider]  pantoprazole (PROTONIX) 40 MG tablet Take 40 mg by mouth daily as needed (gastritis). 02/27/22   [provider]  traMADol (ULTRAM) 50 MG tablet Take 50 mg by mouth every 8 (eight) hours as needed (headache). 05/05/22   [provider]    Current Outpatient Medications  Medication Sig Dispense Refill   acetaminophen (TYLENOL) 500 MG tablet Take 500-1,000 mg by mouth every 6 (six) hours as needed (pain.).     levothyroxine (SYNTHROID, LEVOTHROID) 75 MCG tablet Take 75 mcg by mouth daily before breakfast.     rosuvastatin (CRESTOR) 10 MG tablet Take 10 mg by mouth in the morning.     pantoprazole (PROTONIX) 40 MG tablet Take 40 mg by mouth daily as needed (gastritis).     traMADol (ULTRAM) 50 MG tablet Take 50 mg by mouth every 8 (eight) hours as needed (headache).     Current Facility-Administered Medications  Medication Dose Route Frequency Provider Last Rate Last Admin   0.9 %  sodium chloride infusion  500 mL Intravenous Once Shaley Leavens, Carie Caddy, MD        Allergies as of 08/20/2023   (No Known Allergies)    Family History  Problem Relation Age of Onset   Heart disease Mother    Colon polyps Mother    Prostate cancer Father    Dementia Father    Breast cancer Maternal Aunt  in 40's   Breast cancer Maternal Aunt        in 70's   Stomach cancer Maternal Uncle    Esophageal cancer Maternal Uncle    Stroke Maternal Grandmother    Diabetes Paternal Grandmother    Colon cancer Neg Hx    Rectal cancer Neg Hx     Social History   Socioeconomic History   Marital status: Married    Spouse name: Not on file   Number of children: Not on file   Years of education: Not on file   Highest education level: Not on file  Occupational History   Not on file  Tobacco Use   Smoking status: Never   Smokeless tobacco: Never   Tobacco comments:    Never smoke 04/21/22  Substance and Sexual Activity   Alcohol  use: Yes    Alcohol/week: 2.0 - 4.0 standard drinks of alcohol    Types: 2 - 4 Standard drinks or equivalent per week    Comment: 1-2 mixed drinks 1-2 a month 04/21/22   Drug use: No   Sexual activity: Not on file  Other Topics Concern   Not on file  Social History Narrative   Not on file   Social Determinants of Health   Financial Resource Strain: Not on file  Food Insecurity: Not on file  Transportation Needs: Not on file  Physical Activity: Not on file  Stress: Not on file  Social Connections: Not on file  Intimate Partner Violence: Not on file    Physical Exam: Vital signs in last 24 hours: @BP  (!) 148/88   Pulse 63   Temp 98.8 F (37.1 C)   Ht 5\' 5"  (1.651 m)   Wt 199 lb (90.3 kg)   LMP 04/04/2011   SpO2 97%   BMI 33.12 kg/m  GEN: NAD EYE: Sclerae anicteric ENT: MMM CV: Non-tachycardic Pulm: CTA b/l GI: Soft, NT/ND NEURO:  Alert & Oriented x 3   Erick Blinks, MD [facility]  08/20/2023 11:36 AM

## 2023-08-20 NOTE — Op Note (Signed)
[facility] Patient Name: Cynthia Flowers Procedure Date: 08/20/2023 11:31 AM MRN: [id number] Endoscopist: Beverley Fiedler , MD, [id number] Age: 57 Referring MD:  Date of Birth: [birth year] Gender: Female Account #: [id number] Procedure:                Colonoscopy Indications:              High risk colon cancer surveillance: Personal                            history of non-advanced adenomas, Last colonoscopy:                            March 2017 (2 adenomas both < 10 mm) Medicines:                Monitored Anesthesia Care Procedure:                Pre-Anesthesia Assessment:                           - Prior to the procedure, a History and Physical                            was performed, and patient medications and                            allergies were reviewed. The patient's tolerance of                            previous anesthesia was also reviewed. The risks                            and benefits of the procedure and the sedation                            options and risks were discussed with the patient.                            All questions were answered, and informed consent                            was obtained. Prior Anticoagulants: The patient has                            taken no anticoagulant or antiplatelet agents. ASA                            Grade Assessment: II - A patient with mild systemic                            disease. After reviewing the risks and benefits,                            the patient was deemed in satisfactory condition to  undergo the procedure.                           After obtaining informed consent, the colonoscope                            was passed under direct vision. Throughout the                            procedure, the patient's blood pressure, pulse, and                            oxygen saturations were monitored continuously. The                            Olympus Scope SN: [id number]  was introduced through                            the anus and advanced to the cecum, identified by                            appendiceal orifice and ileocecal valve. The                            colonoscopy was performed without difficulty. The                            patient tolerated the procedure well. The quality                            of the bowel preparation was good. The ileocecal                            valve, appendiceal orifice, and rectum were                            photographed. Scope In: 11:44:26 AM Scope Out: 12:01:28 PM Scope Withdrawal Time: 0 hours 14 minutes 4 seconds  Total Procedure Duration: 0 hours 17 minutes 2 seconds  Findings:                 The digital rectal exam was normal.                           A 3 mm polyp was found in the distal rectum. The                            polyp was sessile. The polyp was removed with a                            cold snare. Resection and retrieval were complete.                           The exam was otherwise without abnormality on  direct and retroflexion views. Complications:            No immediate complications. Estimated Blood Loss:     Estimated blood loss: none. Impression:               - One 3 mm polyp in the distal rectum, removed with                            a cold snare. Resected and retrieved.                           - The examination was otherwise normal on direct                            and retroflexion views. Recommendation:           - Patient has a contact number available for                            emergencies. The signs and symptoms of potential                            delayed complications were discussed with the                            patient. Return to normal activities tomorrow.                            Written discharge instructions were provided to the                            patient.                           - Resume previous  diet.                           - Continue present medications.                           - Await pathology results.                           - Repeat colonoscopy is recommended for                            surveillance. The colonoscopy date will be                            determined after pathology results from today's                            exam become available for review. Beverley Fiedler, MD 08/20/2023 12:04:05 PM This report has been signed electronically.

## 2023-08-20 NOTE — Patient Instructions (Signed)
Handout provided about polyps.  Resume previous diet.  Continue present medications.  Await pathology results.  Repeat colonoscopy is recommended for surveillance.   The colonoscopy date will be determined after pathology results from today's exam become available for review.    YOU HAD AN ENDOSCOPIC PROCEDURE TODAY AT [facility]:   Refer to the procedure report that was given to you for any specific questions about what was found during the examination.  If the procedure report does not answer your questions, please call your gastroenterologist to clarify.  If you requested that your care partner not be given the details of your procedure findings, then the procedure report has been included in a sealed envelope for you to review at your convenience later.  YOU SHOULD EXPECT: Some feelings of bloating in the abdomen. Passage of more gas than usual.  Walking can help get rid of the air that was put into your GI tract during the procedure and reduce the bloating. If you had a lower endoscopy (such as a colonoscopy or flexible sigmoidoscopy) you may notice spotting of blood in your stool or on the toilet paper. If you underwent a bowel prep for your procedure, you may not have a normal bowel movement for a few days.  Please Note:  You might notice some irritation and congestion in your nose or some drainage.  This is from the oxygen used during your procedure.  There is no need for concern and it should clear up in a day or so.  SYMPTOMS TO REPORT IMMEDIATELY:  Following lower endoscopy (colonoscopy or flexible sigmoidoscopy):  Excessive amounts of blood in the stool  Significant tenderness or worsening of abdominal pains  Swelling of the abdomen that is new, acute  Fever of 100F or higher  For urgent or emergent issues, a gastroenterologist can be reached at any hour by calling [phone]. Do not use MyChart messaging for urgent concerns.    DIET:  We do recommend a small  meal at first, but then you may proceed to your regular diet.  Drink plenty of fluids but you should avoid alcoholic beverages for 24 hours.  ACTIVITY:  You should plan to take it easy for the rest of today and you should NOT DRIVE or use heavy machinery until tomorrow (because of the sedation medicines used during the test).    FOLLOW UP: Our staff will call the number listed on your records the next business day following your procedure.  We will call around 7:15- 8:00 am to check on you and address any questions or concerns that you may have regarding the information given to you following your procedure. If we do not reach you, we will leave a message.     If any biopsies were taken you will be contacted by phone or by letter within the next 1-3 weeks.  Please call us at [phone] if you have not heard about the biopsies in 3 weeks.    SIGNATURES/CONFIDENTIALITY: You and/or your care partner have signed paperwork which will be entered into your electronic medical record.  These signatures attest to the fact that that the information above on your After Visit Summary has been reviewed and is understood.  Full responsibility of the confidentiality of this discharge information lies with you and/or your care-partner.

## 2023-08-20 NOTE — Progress Notes (Signed)
Uneventful anesthetic. Report to pacu rn. Vss. Care resumed by rn. 

## 2023-08-20 NOTE — Progress Notes (Signed)
Called to room to assist during endoscopic procedure.  Patient ID and intended procedure confirmed with present staff. Received instructions for my participation in the procedure from the performing physician.  

## 2023-08-23 ENCOUNTER — Telehealth: Payer: Self-pay

## 2023-08-23 NOTE — Telephone Encounter (Signed)
  Follow up Call-     08/20/2023   10:46 AM  Call back number  Post procedure Call Back phone  # [phone]  Permission to leave phone message Yes     Patient questions:  Do you have a fever, pain , or abdominal swelling? No. Pain Score  0 *  Have you tolerated food without any problems? Yes.    Have you been able to return to your normal activities? Yes.    Do you have any questions about your discharge instructions: Diet   No. Medications  No. Follow up visit  No.  Do you have questions or concerns about your Care? No.  Actions: * If pain score is 4 or above: No action needed, pain <4.

## 2023-08-24 LAB — SURGICAL PATHOLOGY

## 2023-08-30 ENCOUNTER — Ambulatory Visit
Admission: RE | Admit: 2023-08-30 | Discharge: 2023-08-30 | Disposition: A | Discharge status: Home / Self Care | Payer: BC Managed Care – PPO | Source: Ambulatory Visit | Attending: Obstetrics and Gynecology | Admitting: Obstetrics and Gynecology

## 2023-08-30 DIAGNOSIS — Z1231 Encounter for screening mammogram for malignant neoplasm of breast: Secondary | ICD-10-CM

## 2023-09-01 ENCOUNTER — Encounter: Payer: Self-pay | Admitting: Internal Medicine

## 2024-04-28 DIAGNOSIS — Z6834 Body mass index (BMI) 34.0-34.9, adult: Secondary | ICD-10-CM | POA: Diagnosis not present

## 2024-04-28 DIAGNOSIS — Z01419 Encounter for gynecological examination (general) (routine) without abnormal findings: Secondary | ICD-10-CM | POA: Diagnosis not present

## 2024-05-09 DIAGNOSIS — E785 Hyperlipidemia, unspecified: Secondary | ICD-10-CM | POA: Diagnosis not present

## 2024-05-09 DIAGNOSIS — E039 Hypothyroidism, unspecified: Secondary | ICD-10-CM | POA: Diagnosis not present

## 2024-05-18 DIAGNOSIS — I4891 Unspecified atrial fibrillation: Secondary | ICD-10-CM | POA: Diagnosis not present

## 2024-05-18 DIAGNOSIS — Z1331 Encounter for screening for depression: Secondary | ICD-10-CM | POA: Diagnosis not present

## 2024-05-18 DIAGNOSIS — Z0000 Encounter for general adult medical examination without abnormal findings: Secondary | ICD-10-CM | POA: Diagnosis not present

## 2024-05-18 DIAGNOSIS — Z1339 Encounter for screening examination for other mental health and behavioral disorders: Secondary | ICD-10-CM | POA: Diagnosis not present

## 2024-05-18 DIAGNOSIS — R82998 Other abnormal findings in urine: Secondary | ICD-10-CM | POA: Diagnosis not present

## 2024-06-07 DIAGNOSIS — H04123 Dry eye syndrome of bilateral lacrimal glands: Secondary | ICD-10-CM | POA: Diagnosis not present

## 2024-06-07 DIAGNOSIS — H5213 Myopia, bilateral: Secondary | ICD-10-CM | POA: Diagnosis not present

## 2024-07-19 ENCOUNTER — Other Ambulatory Visit: Payer: Self-pay | Admitting: Obstetrics and Gynecology

## 2024-07-19 DIAGNOSIS — Z1231 Encounter for screening mammogram for malignant neoplasm of breast: Secondary | ICD-10-CM

## 2024-08-01 ENCOUNTER — Ambulatory Visit (HOSPITAL_COMMUNITY)
Admission: RE | Admit: 2024-08-01 | Discharge: 2024-08-01 | Disposition: A | Discharge status: Home / Self Care | Source: Ambulatory Visit | Attending: Physician Assistant | Admitting: Physician Assistant

## 2024-08-01 VITALS — BP 132/82 | HR 64 | Ht 65.0 in | Wt 211.2 lb

## 2024-08-01 DIAGNOSIS — I4819 Other persistent atrial fibrillation: Secondary | ICD-10-CM | POA: Diagnosis not present

## 2024-08-01 DIAGNOSIS — I4891 Unspecified atrial fibrillation: Secondary | ICD-10-CM | POA: Diagnosis not present

## 2024-08-01 NOTE — Progress Notes (Signed)
 Primary Care Physician: Avva, Ravisankar, MD Primary Cardiologist: none Primary Electrophysiologist: Dr Inocencio  Referring Physician: Dr Janey Jon LULLA Cynthia Flowers is a 58 y.o. female with a history of HLD, OSA, hypothyroidism, atrial fibrillation who presents for follow up in the [facility].  The patient was initially diagnosed with atrial fibrillation 04/15/22 after presenting to her PCP for a routine visit. She admits she has had palpitations off and on for about 3 years. She has several family members with afib, her mother is a patient of Dr Inocencio. There does not seem to be any particular trigger for her palpitations. Patient has a CHADS2VASC score of 1. She does admit to snoring and witnessed apnea. Patient wore a cardiac monitor which showed 100% afib burden avg HR 97 bpm with rapid rates at times.   Patient is s/p DCCV on 05/28/22 but had quick return of her afib. She had a sleep study and was diagnosed with severe OSA. Patient was seen by Dr Inocencio and underwent an afib ablation on 10/06/22.  Patient returns for follow up for atrial fibrillation. Patient reports she has done well since her last visit. She denies any interim symptoms of afib.   Today, she  denies symptoms of palpitations, chest pain, shortness of breath, orthopnea, PND, lower extremity edema, dizziness, presyncope, syncope, bleeding, or neurologic sequela. The patient is tolerating medications without difficulties and is otherwise without complaint today.    Atrial Fibrillation Risk Factors:  she does have symptoms or diagnosis of sleep apnea. she does not have a history of rheumatic fever. she does not have a history of alcohol  use. The patient does have a history of early familial atrial fibrillation or other arrhythmias. Many family members have afib.   Atrial Fibrillation Management history:  Previous antiarrhythmic drugs: none Previous cardioversions: 05/28/22 Previous ablations:  10/06/22 Anticoagulation history: Xarelto     Past Medical History:  Diagnosis Date   Allergy    Family history of adverse reaction to anesthesia    High cholesterol    History of migraine    Hypothyroidism    Mitral valve prolapse    mild   Sleep apnea     Current Outpatient Medications  Medication Sig Dispense Refill   acetaminophen  (TYLENOL ) 500 MG tablet Take 500-1,000 mg by mouth every 6 (six) hours as needed (pain.). (Patient taking differently: Take 500-1,000 mg by mouth as needed (pain.).)     levothyroxine (SYNTHROID, LEVOTHROID) 75 MCG tablet Take 75 mcg by mouth daily before breakfast.     pantoprazole (PROTONIX) 40 MG tablet Take 40 mg by mouth daily as needed (gastritis). (Patient taking differently: Take 40 mg by mouth as needed (gastritis).)     rosuvastatin (CRESTOR) 10 MG tablet Take 10 mg by mouth in the morning.     traMADol (ULTRAM) 50 MG tablet Take 50 mg by mouth every 8 (eight) hours as needed (headache). (Patient taking differently: Take 50 mg by mouth as needed (headache).)     No current facility-administered medications for this encounter.    ROS- All systems are reviewed and negative except as per the HPI above.  Physical Exam: Vitals:   08/01/24 1514  BP: 132/82  Pulse: 64  Weight: 95.8 kg  Height: 5' 5 (1.651 m)   GEN: Well nourished, well developed in no acute distress CARDIAC: Regular rate and rhythm, no murmurs, rubs, gallops RESPIRATORY:  Clear to auscultation without rales, wheezing or rhonchi  ABDOMEN: Soft, non-tender, non-distended EXTREMITIES:  No edema; No deformity    Wt Readings from Last 3 Encounters:  08/01/24 95.8 kg  08/20/23 90.3 kg  08/06/23 90.3 kg    EKG today demonstrates  SR Vent. rate 64 BPM PR interval 144 ms QRS duration 86 ms QT/QTcB 426/439 ms   Echo 05/05/22  1. Left ventricular ejection fraction, by estimation, is 60 to 65%. The  left ventricle has normal function. The left ventricle has no regional   wall motion abnormalities. Diastolic function indeterminant due to Afib.   2. Right ventricular systolic function is normal. The right ventricular  size is mildly enlarged. There is normal pulmonary artery systolic  pressure. The estimated right ventricular systolic pressure is 25.5 mmHg.   3. The mitral valve is normal in structure. Trivial mitral valve  regurgitation.   4. The aortic valve is tricuspid. Aortic valve regurgitation is not  visualized. No aortic stenosis is present.   5. Aortic dilatation noted. There is borderline dilatation of the  ascending aorta, measuring 38 mm.   6. The inferior vena cava is dilated in size with >50% respiratory  variability, suggesting right atrial pressure of 8 mmHg.   Comparison(s): No prior Echocardiogram.   Epic records are reviewed at length today  CHA2DS2-VASc Score = 1  The patient's score is based upon: CHF History: 0 HTN History: 0 Diabetes History: 0 Stroke History: 0 Vascular Disease History: 0 Age Score: 0 Gender Score: 1       ASSESSMENT AND PLAN: Persistent Atrial Fibrillation (ICD10:  I48.19) The patient's CHA2DS2-VASc score is 1, indicating a 0.6% annual risk of stroke.   S/p afib ablation 10/06/22 Patient appears to be maintaining SR Not currently on anticoagulation with low CV score.   OSA  Seen by Dr Micky, encouraged use of oral appliance.     Follow up with Dr Inocencio in one year.    Daril Kicks PA-C [facility] [address] [place], [place] [address] [phone] 08/01/2024 3:54 PM

## 2024-08-30 ENCOUNTER — Ambulatory Visit
Admission: RE | Admit: 2024-08-30 | Discharge: 2024-08-30 | Disposition: A | Discharge status: Home / Self Care | Source: Ambulatory Visit | Attending: Obstetrics and Gynecology | Admitting: Obstetrics and Gynecology

## 2024-08-30 DIAGNOSIS — Z1231 Encounter for screening mammogram for malignant neoplasm of breast: Secondary | ICD-10-CM

## 2024-10-25 DIAGNOSIS — M79645 Pain in left finger(s): Secondary | ICD-10-CM | POA: Diagnosis not present
